# Patient Record
Sex: Female | Born: 1975 | Race: White | Hispanic: No | Marital: Single | Smoking: Never smoker
Health system: Southern US, Community
[De-identification: ages and names within clinical notes are randomized; demographics above are authoritative.]

## PROBLEM LIST (undated history)

## (undated) DIAGNOSIS — I10 Essential (primary) hypertension: Secondary | ICD-10-CM

## (undated) HISTORY — DX: Essential (primary) hypertension: I10

## (undated) HISTORY — PX: NO PAST SURGERIES: SHX2092

---

## 1998-10-03 ENCOUNTER — Other Ambulatory Visit: Admission: RE | Admit: 1998-10-03 | Discharge: 1998-10-03 | Payer: Self-pay | Admitting: *Deleted

## 2006-01-05 ENCOUNTER — Emergency Department: Payer: Self-pay | Admitting: Emergency Medicine

## 2006-01-05 ENCOUNTER — Other Ambulatory Visit: Payer: Self-pay

## 2018-04-21 ENCOUNTER — Telehealth: Payer: Self-pay | Admitting: Family Medicine

## 2018-04-21 NOTE — Telephone Encounter (Signed)
Called patient to speak with her about her medical records request from when she was here in 2006.  Please send to MR if she calls back.

## 2021-10-20 ENCOUNTER — Other Ambulatory Visit: Payer: Self-pay

## 2021-10-20 ENCOUNTER — Emergency Department: Payer: Self-pay

## 2021-10-20 ENCOUNTER — Inpatient Hospital Stay
Admission: EM | Admit: 2021-10-20 | Discharge: 2021-10-26 | DRG: 871 | Disposition: A | Discharge status: Home / Self Care | Payer: Self-pay | Attending: Hospitalist | Admitting: Hospitalist

## 2021-10-20 ENCOUNTER — Encounter: Payer: Self-pay | Admitting: Internal Medicine

## 2021-10-20 DIAGNOSIS — R600 Localized edema: Secondary | ICD-10-CM | POA: Diagnosis present

## 2021-10-20 DIAGNOSIS — Z6841 Body Mass Index (BMI) 40.0 and over, adult: Secondary | ICD-10-CM

## 2021-10-20 DIAGNOSIS — F411 Generalized anxiety disorder: Secondary | ICD-10-CM | POA: Diagnosis present

## 2021-10-20 DIAGNOSIS — Z833 Family history of diabetes mellitus: Secondary | ICD-10-CM

## 2021-10-20 DIAGNOSIS — Z77098 Contact with and (suspected) exposure to other hazardous, chiefly nonmedicinal, chemicals: Secondary | ICD-10-CM | POA: Diagnosis present

## 2021-10-20 DIAGNOSIS — I071 Rheumatic tricuspid insufficiency: Secondary | ICD-10-CM | POA: Diagnosis present

## 2021-10-20 DIAGNOSIS — I160 Hypertensive urgency: Secondary | ICD-10-CM | POA: Diagnosis present

## 2021-10-20 DIAGNOSIS — M7989 Other specified soft tissue disorders: Secondary | ICD-10-CM | POA: Diagnosis present

## 2021-10-20 DIAGNOSIS — L97919 Non-pressure chronic ulcer of unspecified part of right lower leg with unspecified severity: Secondary | ICD-10-CM | POA: Diagnosis present

## 2021-10-20 DIAGNOSIS — K7581 Nonalcoholic steatohepatitis (NASH): Secondary | ICD-10-CM | POA: Diagnosis present

## 2021-10-20 DIAGNOSIS — A4102 Sepsis due to Methicillin resistant Staphylococcus aureus: Principal | ICD-10-CM | POA: Diagnosis present

## 2021-10-20 DIAGNOSIS — I110 Hypertensive heart disease with heart failure: Secondary | ICD-10-CM | POA: Diagnosis present

## 2021-10-20 DIAGNOSIS — I872 Venous insufficiency (chronic) (peripheral): Secondary | ICD-10-CM | POA: Diagnosis present

## 2021-10-20 DIAGNOSIS — Z20822 Contact with and (suspected) exposure to covid-19: Secondary | ICD-10-CM | POA: Diagnosis present

## 2021-10-20 DIAGNOSIS — R7989 Other specified abnormal findings of blood chemistry: Secondary | ICD-10-CM | POA: Diagnosis present

## 2021-10-20 DIAGNOSIS — F22 Delusional disorders: Secondary | ICD-10-CM | POA: Diagnosis present

## 2021-10-20 DIAGNOSIS — R768 Other specified abnormal immunological findings in serum: Secondary | ICD-10-CM | POA: Diagnosis present

## 2021-10-20 DIAGNOSIS — E872 Acidosis, unspecified: Secondary | ICD-10-CM | POA: Diagnosis present

## 2021-10-20 DIAGNOSIS — Z634 Disappearance and death of family member: Secondary | ICD-10-CM

## 2021-10-20 DIAGNOSIS — I5033 Acute on chronic diastolic (congestive) heart failure: Secondary | ICD-10-CM | POA: Diagnosis present

## 2021-10-20 DIAGNOSIS — A419 Sepsis, unspecified organism: Secondary | ICD-10-CM | POA: Diagnosis present

## 2021-10-20 DIAGNOSIS — L97929 Non-pressure chronic ulcer of unspecified part of left lower leg with unspecified severity: Secondary | ICD-10-CM | POA: Diagnosis present

## 2021-10-20 DIAGNOSIS — I878 Other specified disorders of veins: Secondary | ICD-10-CM | POA: Diagnosis present

## 2021-10-20 DIAGNOSIS — L03116 Cellulitis of left lower limb: Secondary | ICD-10-CM | POA: Diagnosis present

## 2021-10-20 DIAGNOSIS — D649 Anemia, unspecified: Secondary | ICD-10-CM | POA: Diagnosis present

## 2021-10-20 DIAGNOSIS — R140 Abdominal distension (gaseous): Secondary | ICD-10-CM | POA: Diagnosis present

## 2021-10-20 DIAGNOSIS — L03115 Cellulitis of right lower limb: Secondary | ICD-10-CM | POA: Diagnosis present

## 2021-10-20 DIAGNOSIS — R6520 Severe sepsis without septic shock: Secondary | ICD-10-CM | POA: Diagnosis present

## 2021-10-20 DIAGNOSIS — R601 Generalized edema: Secondary | ICD-10-CM

## 2021-10-20 DIAGNOSIS — R10819 Abdominal tenderness, unspecified site: Secondary | ICD-10-CM

## 2021-10-20 DIAGNOSIS — R000 Tachycardia, unspecified: Secondary | ICD-10-CM | POA: Diagnosis present

## 2021-10-20 DIAGNOSIS — R809 Proteinuria, unspecified: Secondary | ICD-10-CM | POA: Diagnosis present

## 2021-10-20 LAB — CBC WITH DIFFERENTIAL/PLATELET
Abs Immature Granulocytes: 0.06 10*3/uL (ref 0.00–0.07)
Basophils Absolute: 0 10*3/uL (ref 0.0–0.1)
Basophils Relative: 0 %
Eosinophils Absolute: 0 10*3/uL (ref 0.0–0.5)
Eosinophils Relative: 0 %
HCT: 36.8 % (ref 36.0–46.0)
Hemoglobin: 10.8 g/dL — ABNORMAL LOW (ref 12.0–15.0)
Immature Granulocytes: 1 %
Lymphocytes Relative: 10 %
Lymphs Abs: 1.2 10*3/uL (ref 0.7–4.0)
MCH: 21.2 pg — ABNORMAL LOW (ref 26.0–34.0)
MCHC: 29.3 g/dL — ABNORMAL LOW (ref 30.0–36.0)
MCV: 72.3 fL — ABNORMAL LOW (ref 80.0–100.0)
Monocytes Absolute: 1 10*3/uL (ref 0.1–1.0)
Monocytes Relative: 8 %
Neutro Abs: 9.5 10*3/uL — ABNORMAL HIGH (ref 1.7–7.7)
Neutrophils Relative %: 81 %
Platelets: 433 10*3/uL — ABNORMAL HIGH (ref 150–400)
RBC: 5.09 MIL/uL (ref 3.87–5.11)
RDW: 21 % — ABNORMAL HIGH (ref 11.5–15.5)
WBC: 11.8 10*3/uL — ABNORMAL HIGH (ref 4.0–10.5)
nRBC: 0.2 % (ref 0.0–0.2)

## 2021-10-20 LAB — URINE DRUG SCREEN, QUALITATIVE (ARMC ONLY)
Amphetamines, Ur Screen: NOT DETECTED
Barbiturates, Ur Screen: NOT DETECTED
Benzodiazepine, Ur Scrn: NOT DETECTED
Cannabinoid 50 Ng, Ur ~~LOC~~: NOT DETECTED
Cocaine Metabolite,Ur ~~LOC~~: NOT DETECTED
MDMA (Ecstasy)Ur Screen: NOT DETECTED
Methadone Scn, Ur: NOT DETECTED
Opiate, Ur Screen: NOT DETECTED
Phencyclidine (PCP) Ur S: NOT DETECTED
Tricyclic, Ur Screen: NOT DETECTED

## 2021-10-20 LAB — COMPREHENSIVE METABOLIC PANEL
ALT: 34 U/L (ref 0–44)
AST: 45 U/L — ABNORMAL HIGH (ref 15–41)
Albumin: 3.8 g/dL (ref 3.5–5.0)
Alkaline Phosphatase: 80 U/L (ref 38–126)
Anion gap: 8 (ref 5–15)
BUN: 16 mg/dL (ref 6–20)
CO2: 24 mmol/L (ref 22–32)
Calcium: 8.7 mg/dL — ABNORMAL LOW (ref 8.9–10.3)
Chloride: 103 mmol/L (ref 98–111)
Creatinine, Ser: 0.94 mg/dL (ref 0.44–1.00)
GFR, Estimated: >60 mL/min (ref >60)
Glucose, Bld: 111 mg/dL — ABNORMAL HIGH (ref 70–99)
Potassium: 3.6 mmol/L (ref 3.5–5.1)
Sodium: 135 mmol/L (ref 135–145)
Total Bilirubin: 2.1 mg/dL — ABNORMAL HIGH (ref 0.3–1.2)
Total Protein: 7.4 g/dL (ref 6.5–8.1)

## 2021-10-20 LAB — URINALYSIS, ROUTINE W REFLEX MICROSCOPIC
Bilirubin Urine: NEGATIVE
Glucose, UA: NEGATIVE mg/dL
Hgb urine dipstick: NEGATIVE
Ketones, ur: 5 mg/dL — AB
Leukocytes,Ua: NEGATIVE
Nitrite: NEGATIVE
Protein, ur: 100 mg/dL — AB
Specific Gravity, Urine: 1.02 (ref 1.005–1.030)
pH: 5 (ref 5.0–8.0)

## 2021-10-20 LAB — PROTIME-INR
INR: 1.4 — ABNORMAL HIGH (ref 0.8–1.2)
Prothrombin Time: 16.9 seconds — ABNORMAL HIGH (ref 11.4–15.2)

## 2021-10-20 LAB — SALICYLATE LEVEL: Salicylate Lvl: <7 mg/dL — ABNORMAL LOW (ref 7.0–30.0)

## 2021-10-20 LAB — SEDIMENTATION RATE: Sed Rate: 7 mm/hr (ref 0–20)

## 2021-10-20 LAB — LACTIC ACID, PLASMA
Lactic Acid, Venous: 2.4 mmol/L (ref 0.5–1.9)
Lactic Acid, Venous: 2.1 mmol/L (ref 0.5–1.9)
Lactic Acid, Venous: 2.4 mmol/L (ref 0.5–1.9)
Lactic Acid, Venous: 1.8 mmol/L (ref 0.5–1.9)

## 2021-10-20 LAB — RESP PANEL BY RT-PCR (FLU A&B, COVID) ARPGX2
Influenza A by PCR: NEGATIVE
Influenza B by PCR: NEGATIVE
SARS Coronavirus 2 by RT PCR: NEGATIVE

## 2021-10-20 LAB — HIV ANTIBODY (ROUTINE TESTING W REFLEX): HIV Screen 4th Generation wRfx: NONREACTIVE

## 2021-10-20 LAB — APTT: aPTT: 29 seconds (ref 24–36)

## 2021-10-20 LAB — BRAIN NATRIURETIC PEPTIDE: B Natriuretic Peptide: 569.4 pg/mL — ABNORMAL HIGH (ref 0.0–100.0)

## 2021-10-20 LAB — ACETAMINOPHEN LEVEL: Acetaminophen (Tylenol), Serum: 19 ug/mL (ref 10–30)

## 2021-10-20 LAB — POC URINE PREG, ED: Preg Test, Ur: NEGATIVE

## 2021-10-20 LAB — ETHANOL: Alcohol, Ethyl (B): <10 mg/dL (ref <10)

## 2021-10-20 LAB — PROCALCITONIN: Procalcitonin: <0.1 ng/mL

## 2021-10-20 MED ORDER — HYDRALAZINE HCL 20 MG/ML IJ SOLN: 5.0000 mg | INTRAMUSCULAR | Status: DC | PRN

## 2021-10-20 MED ORDER — GABAPENTIN 100 MG PO CAPS
100.0000 mg | ORAL_CAPSULE | Freq: Three times a day (TID) | ORAL | Status: DC
  Administered 2021-10-20 – 2021-10-26 (×17): 100 mg via ORAL
  Filled 2021-10-20 (×18): qty 1

## 2021-10-20 MED ORDER — LABETALOL HCL 5 MG/ML IV SOLN
5.0000 mg | Freq: Once | INTRAVENOUS | Status: AC
  Administered 2021-10-20: 5 mg via INTRAVENOUS
  Filled 2021-10-20: qty 4

## 2021-10-20 MED ORDER — HYDRALAZINE HCL 20 MG/ML IJ SOLN
10.0000 mg | Freq: Once | INTRAMUSCULAR | Status: AC
  Administered 2021-10-20: 10 mg via INTRAVENOUS
  Filled 2021-10-20: qty 1

## 2021-10-20 MED ORDER — ACETAMINOPHEN 500 MG PO TABS
1000.0000 mg | ORAL_TABLET | Freq: Once | ORAL | Status: AC
  Administered 2021-10-20: 1000 mg via ORAL
  Filled 2021-10-20: qty 2

## 2021-10-20 MED ORDER — ONDANSETRON HCL 4 MG/2ML IJ SOLN: 4.0000 mg | Freq: Three times a day (TID) | INTRAMUSCULAR | Status: DC | PRN

## 2021-10-20 MED ORDER — ENOXAPARIN SODIUM 60 MG/0.6ML IJ SOSY
0.5000 mg/kg | PREFILLED_SYRINGE | INTRAMUSCULAR | Status: DC
  Administered 2021-10-20 – 2021-10-23 (×4): 57.5 mg via SUBCUTANEOUS
  Administered 2021-10-24 – 2021-10-25 (×2): 60 mg via SUBCUTANEOUS
  Filled 2021-10-20: qty 0.57
  Filled 2021-10-20: qty 0.6
  Filled 2021-10-20: qty 0.57
  Filled 2021-10-20: qty 0.6
  Filled 2021-10-20: qty 0.57
  Filled 2021-10-20: qty 0.6

## 2021-10-20 MED ORDER — AMLODIPINE BESYLATE 5 MG PO TABS
5.0000 mg | ORAL_TABLET | Freq: Every day | ORAL | Status: DC
  Administered 2021-10-20 – 2021-10-21 (×2): 5 mg via ORAL
  Filled 2021-10-20 (×2): qty 1

## 2021-10-20 MED ORDER — IBUPROFEN 400 MG PO TABS
200.0000 mg | ORAL_TABLET | Freq: Four times a day (QID) | ORAL | Status: DC | PRN
  Administered 2021-10-21: 200 mg via ORAL
  Filled 2021-10-20: qty 1

## 2021-10-20 MED ORDER — FUROSEMIDE 10 MG/ML IJ SOLN
40.0000 mg | Freq: Once | INTRAMUSCULAR | Status: AC
  Administered 2021-10-20: 40 mg via INTRAVENOUS
  Filled 2021-10-20: qty 4

## 2021-10-20 MED ORDER — SODIUM CHLORIDE 0.9 % IV SOLN
2.0000 g | INTRAVENOUS | Status: DC
  Administered 2021-10-21 – 2021-10-23 (×3): 2 g via INTRAVENOUS
  Filled 2021-10-20: qty 20
  Filled 2021-10-20: qty 2
  Filled 2021-10-20 (×2): qty 20

## 2021-10-20 MED ORDER — OXYCODONE HCL 5 MG PO TABS
5.0000 mg | ORAL_TABLET | Freq: Four times a day (QID) | ORAL | Status: DC | PRN
  Administered 2021-10-20 – 2021-10-22 (×5): 5 mg via ORAL
  Filled 2021-10-20 (×6): qty 1

## 2021-10-20 MED ORDER — SODIUM CHLORIDE 0.9 % IV SOLN
1.0000 g | Freq: Once | INTRAVENOUS | Status: AC
  Administered 2021-10-20: 1 g via INTRAVENOUS
  Filled 2021-10-20: qty 10

## 2021-10-20 MED ORDER — ENOXAPARIN SODIUM 40 MG/0.4ML IJ SOSY: 40.0000 mg | PREFILLED_SYRINGE | INTRAMUSCULAR | Status: DC

## 2021-10-20 MED ORDER — OXYCODONE HCL 5 MG PO TABS
5.0000 mg | ORAL_TABLET | Freq: Once | ORAL | Status: AC
  Administered 2021-10-20: 5 mg via ORAL
  Filled 2021-10-20: qty 1
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
## 2021-10-20 NOTE — ED Notes (Signed)
RN unable to obtain lactic after obtaining IV access.
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
## 2021-10-20 NOTE — Progress Notes (Signed)
PHARMACIST - PHYSICIAN COMMUNICATION  CONCERNING:  Enoxaparin (Lovenox) for DVT Prophylaxis    RECOMMENDATION: Patient was prescribed enoxaprin 40mg  q24 hours for VTE prophylaxis.   Filed Weights   10/20/21 0917  Weight: 117 kg (258 lb)    Body mass index is 42.93 kg/m.  Estimated Creatinine Clearance: 94.6 mL/min (by C-G formula based on SCr of 0.94 mg/dL).   Based on [facility] policy patient is candidate for enoxaparin 0.5mg /kg TBW SQ every 24 hours based on BMI being >30.  Patient is candidate for enoxaparin 30mg  every 24 hours based on CrCl <67ml/min or Weight <45kg  DESCRIPTION: Pharmacy has adjusted enoxaparin dose per [facility] policy.  Patient is now receiving enoxaparin 57.5 mg every 24 hours    31m, PharmD Clinical Pharmacist  10/20/2021 4:49 PM

## 2021-10-20 NOTE — ED Notes (Addendum)
Lab called to collect blood specimen

## 2021-10-20 NOTE — ED Notes (Addendum)
IV team blood draw able to obtain 1 set of blood cultures at this time. MD made aware. Still awaiting lab to attempt 2nd set

## 2021-10-20 NOTE — ED Notes (Signed)
Dinner tray given

## 2021-10-20 NOTE — ED Triage Notes (Signed)
Pt reports not seeing a dr since 2015, no daily meds or surgeries, pt states that she noticed her feet swelling once she was in the bath tub that draino had been poured down the drain, pt states she then had swelling that moved up into her abd and cont to have swelling there, pt has weeping noticed coming from her legs while in triage and puddiling in her crocs,10 days later pt noticed wounds and sores to her feet and legs, pt is visibly out of breath talking and with walking into triage bp in triage of 244/136 after checking three times and changing bp cuffs

## 2021-10-20 NOTE — ED Notes (Signed)
IV team at bedside to attempt IV access 

## 2021-10-20 NOTE — ED Notes (Signed)
Lab called to attempt to obtain blood cultures again

## 2021-10-20 NOTE — ED Notes (Signed)
TTS at bedside. 

## 2021-10-20 NOTE — ED Notes (Signed)
Lab unable to obtain blood cultures. Admitting MND made aware.

## 2021-10-21 ENCOUNTER — Inpatient Hospital Stay: Payer: Self-pay

## 2021-10-21 ENCOUNTER — Inpatient Hospital Stay
Admit: 2021-10-21 | Discharge: 2021-10-21 | Disposition: A | Discharge status: Home / Self Care | Payer: Self-pay | Attending: Internal Medicine | Admitting: Internal Medicine

## 2021-10-21 DIAGNOSIS — A419 Sepsis, unspecified organism: Secondary | ICD-10-CM

## 2021-10-21 DIAGNOSIS — I509 Heart failure, unspecified: Secondary | ICD-10-CM

## 2021-10-21 DIAGNOSIS — I161 Hypertensive emergency: Secondary | ICD-10-CM

## 2021-10-21 DIAGNOSIS — R600 Localized edema: Secondary | ICD-10-CM

## 2021-10-21 DIAGNOSIS — R6520 Severe sepsis without septic shock: Secondary | ICD-10-CM

## 2021-10-21 LAB — BASIC METABOLIC PANEL
Anion gap: 13 (ref 5–15)
BUN: 16 mg/dL (ref 6–20)
CO2: 26 mmol/L (ref 22–32)
Calcium: 8.4 mg/dL — ABNORMAL LOW (ref 8.9–10.3)
Chloride: 100 mmol/L (ref 98–111)
Creatinine, Ser: 0.82 mg/dL (ref 0.44–1.00)
GFR, Estimated: >60 mL/min (ref >60)
Glucose, Bld: 82 mg/dL (ref 70–99)
Potassium: 3.8 mmol/L (ref 3.5–5.1)
Sodium: 139 mmol/L (ref 135–145)

## 2021-10-21 LAB — LACTIC ACID, PLASMA
Lactic Acid, Venous: 2.4 mmol/L (ref 0.5–1.9)
Lactic Acid, Venous: 1.8 mmol/L (ref 0.5–1.9)

## 2021-10-21 LAB — CK: Total CK: 310 U/L — ABNORMAL HIGH (ref 38–234)

## 2021-10-21 LAB — CBC
HCT: 35.5 % — ABNORMAL LOW (ref 36.0–46.0)
Hemoglobin: 10.4 g/dL — ABNORMAL LOW (ref 12.0–15.0)
MCH: 21.1 pg — ABNORMAL LOW (ref 26.0–34.0)
MCHC: 29.3 g/dL — ABNORMAL LOW (ref 30.0–36.0)
MCV: 71.9 fL — ABNORMAL LOW (ref 80.0–100.0)
Platelets: 395 10*3/uL (ref 150–400)
RBC: 4.94 MIL/uL (ref 3.87–5.11)
RDW: 20.9 % — ABNORMAL HIGH (ref 11.5–15.5)
WBC: 11.6 10*3/uL — ABNORMAL HIGH (ref 4.0–10.5)
nRBC: 0 % (ref 0.0–0.2)

## 2021-10-21 LAB — C-REACTIVE PROTEIN: CRP: 3.4 mg/dL — ABNORMAL HIGH (ref <1.0)

## 2021-10-21 LAB — MRSA NEXT GEN BY PCR, NASAL: MRSA by PCR Next Gen: DETECTED — AB

## 2021-10-21 LAB — MAGNESIUM: Magnesium: 2.2 mg/dL (ref 1.7–2.4)

## 2021-10-21 MED ORDER — FUROSEMIDE 10 MG/ML IJ SOLN
40.0000 mg | Freq: Every day | INTRAMUSCULAR | Status: DC
  Administered 2021-10-21: 40 mg via INTRAVENOUS
  Filled 2021-10-21: qty 4

## 2021-10-21 MED ORDER — MUPIROCIN 2 % EX OINT
1.0000 "application " | TOPICAL_OINTMENT | Freq: Two times a day (BID) | CUTANEOUS | Status: AC
  Administered 2021-10-21 – 2021-10-26 (×10): 1 via NASAL
  Filled 2021-10-21: qty 22

## 2021-10-21 MED ORDER — BISACODYL 10 MG RE SUPP
10.0000 mg | Freq: Once | RECTAL | Status: DC
  Filled 2021-10-21: qty 1

## 2021-10-21 MED ORDER — METOPROLOL TARTRATE 25 MG PO TABS
25.0000 mg | ORAL_TABLET | Freq: Two times a day (BID) | ORAL | Status: DC
  Administered 2021-10-21 – 2021-10-25 (×8): 25 mg via ORAL
  Filled 2021-10-21 (×8): qty 1

## 2021-10-21 MED ORDER — GUAIFENESIN-DM 100-10 MG/5ML PO SYRP
5.0000 mL | ORAL_SOLUTION | ORAL | Status: DC | PRN
  Administered 2021-10-21 – 2021-10-25 (×6): 5 mL via ORAL
  Filled 2021-10-21 (×6): qty 5

## 2021-10-21 MED ORDER — DOCUSATE SODIUM 100 MG PO CAPS
100.0000 mg | ORAL_CAPSULE | Freq: Once | ORAL | Status: AC
Start: 2021-10-21 — End: 2021-10-21
  Administered 2021-10-21: 100 mg via ORAL
  Filled 2021-10-21: qty 1

## 2021-10-21 MED ORDER — CHLORHEXIDINE GLUCONATE CLOTH 2 % EX PADS
6.0000 | MEDICATED_PAD | Freq: Every day | CUTANEOUS | Status: DC
  Administered 2021-10-22 – 2021-10-25 (×3): 6 via TOPICAL

## 2021-10-21 MED ORDER — ACETAMINOPHEN 325 MG PO TABS
650.0000 mg | ORAL_TABLET | Freq: Four times a day (QID) | ORAL | Status: DC | PRN
  Administered 2021-10-24 – 2021-10-25 (×3): 650 mg via ORAL
  Filled 2021-10-21 (×3): qty 2

## 2021-10-21 MED ORDER — LABETALOL HCL 5 MG/ML IV SOLN
10.0000 mg | INTRAVENOUS | Status: DC | PRN
  Administered 2021-10-21 – 2021-10-23 (×4): 10 mg via INTRAVENOUS
  Filled 2021-10-21 (×5): qty 4

## 2021-10-21 MED ORDER — FUROSEMIDE 10 MG/ML IJ SOLN
40.0000 mg | Freq: Two times a day (BID) | INTRAMUSCULAR | Status: DC
  Administered 2021-10-21 – 2021-10-26 (×10): 40 mg via INTRAVENOUS
  Filled 2021-10-21 (×10): qty 4

## 2021-10-21 NOTE — Evaluation (Signed)
Physical Therapy Evaluation Patient Details Name: Cynthia Hodge MRN: [id number] DOB: [birth year] Today's Date: 10/21/2021  History of Present Illness  Pt is a 45 y/o F admitted on 10/20/21 with c/o leg swelling & ulcerations. Pt reports she got into her bathtub nearly a month ago that had some residual Draino in it & her legs started to swell around that time. Pt reports painful ulcerations with srurrounding redness developed over the past 3-4 days. Pt is being treated for severe sepsis due to cellulitis of lower legs with multiple ulcers & BLE edema of unclear etiology.  Clinical Impression  Pt seen for PT evaluation with pt reporting she was independent without AD prior to admission, able to get in/out of bathtub, & no falls. On this date, pt performs bed mobility with mod I, transfers without AD & gait without AD with gait pattern as noted below. Pt appears to be primarily limited by BLE foot pain as expected, but otherwise has no acute PT needs. Encouraged pt to ambulate while in acute setting as much as possible & limit time in bed. At this time, PT to sign off, please re-consult if new needs arise.       Recommendations for follow up therapy are one component of a multi-disciplinary discharge planning process, led by the attending physician.  Recommendations may be updated based on patient status, additional functional criteria and insurance authorization.  Follow Up Recommendations No PT follow up    Assistance Recommended at Discharge None  Functional Status Assessment Patient has not had a recent decline in their functional status  Equipment Recommendations  None recommended by PT    Recommendations for Other Services       Precautions / Restrictions Precautions Precautions: None Restrictions Weight Bearing Restrictions: No      Mobility  Bed Mobility Overal bed mobility: Modified Independent             General bed mobility comments: supine>sit     Transfers Overall transfer level: Independent Equipment used: None                    Ambulation/Gait Ambulation/Gait assistance: Independent Gait Distance (Feet): 60 Feet Assistive device: None Gait Pattern/deviations: Decreased step length - right;Decreased step length - left;Decreased dorsiflexion - left;Decreased dorsiflexion - right;Wide base of support Gait velocity: decreased     General Gait Details: Pt limited by BLE pain.  Stairs            Wheelchair Mobility    Modified Rankin (Stroke Patients Only)       Balance Overall balance assessment: Mild deficits observed, not formally tested                                           Pertinent Vitals/Pain Pain Assessment: Faces Faces Pain Scale: Hurts little more Pain Location: BLE pain - reports no pain at rest but endorses increasing pain with movement & weight bearing, reports skin is tender Pain Descriptors / Indicators: Discomfort Pain Intervention(s): Limited activity within patient's tolerance;Monitored during session    Home Living Family/patient expects to be discharged to:: Private residence Living Arrangements: Alone Available Help at Discharge: Friend(s);Available PRN/intermittently Type of Home: House Home Access: Level entry       Home Layout: One level Home Equipment: None      Prior Function Prior Level of Function : Independent/Modified Independent  Mobility Comments: Independent without AD, denies falls in the past 6 months. Reports she's had a friend assist her with obtaining meals this past week 2/2 worsening BLE.       Hand Dominance        Extremity/Trunk Assessment   Upper Extremity Assessment Upper Extremity Assessment: Overall WFL for tasks assessed    Lower Extremity Assessment Lower Extremity Assessment:  (BLE edema & erythema, wound to medial aspect of L lower leg)    Cervical / Trunk Assessment Cervical / Trunk  Assessment: Normal  Communication      Cognition Arousal/Alertness: Awake/alert Behavior During Therapy: WFL for tasks assessed/performed Overall Cognitive Status: Within Functional Limits for tasks assessed                                          General Comments      Exercises     Assessment/Plan    PT Assessment Patient does not need any further PT services  PT Problem List         PT Treatment Interventions      PT Goals (Current goals can be found in the Care Plan section)  Acute Rehab PT Goals Patient Stated Goal: decreased pain PT Goal Formulation: With patient Time For Goal Achievement: 11/04/21 Potential to Achieve Goals: Good    Frequency     Barriers to discharge        Co-evaluation               AM-PAC PT "6 Clicks" Mobility  Outcome Measure Help needed turning from your back to your side while in a flat bed without using bedrails?: None Help needed moving from lying on your back to sitting on the side of a flat bed without using bedrails?: None Help needed moving to and from a bed to a chair (including a wheelchair)?: None Help needed standing up from a chair using your arms (e.g., wheelchair or bedside chair)?: None Help needed to walk in hospital room?: None Help needed climbing 3-5 steps with a railing? : None 6 Click Score: 24    End of Session   Activity Tolerance: Patient limited by pain Patient left: with call bell/phone within reach (sitting EOB) Nurse Communication: Mobility status      Time: 7711-6579 PT Time Calculation (min) (ACUTE ONLY): 10 min   Charges:   PT Evaluation $PT Eval Low Complexity: 1 Low          Aleda Grana, PT, DPT 10/21/21, 2:16 PM   Sandi Mariscal 10/21/2021, 2:15 PM

## 2021-10-21 NOTE — Progress Notes (Signed)
PROGRESS NOTE  Cynthia Hodge LMB:[id number] DOB: 1976/06/21   PCP: Pcp, No  Patient is from: Home.  DOA: 10/20/2021 LOS: 1  Chief complaints:  Chief Complaint  Patient presents with   Chemical Exposure   Leg Swelling   Shortness of Breath     Brief Narrative / Interim history: 45 year old F with PMH of GAD and whitecoat hypertension presenting with BLE swelling, pain, ulcer, weight gain and abdominal swelling, and admitted for possible lower extremity cellulitis with ulceration, new CHF and uncontrolled hypertension.  No report of fever or chills.  BP 244/141.  HR 124.  RR 26.  Normal saturation on room air.  WBC 11.8.  Lactic acid 2.4.  BNP 570.  T bili 2.1.  CXR showed cardiomegaly and interstitial edema.  LE [place] negative for DVT.  Patient was started on IV ceftriaxone, IV Lasix.  TTE ordered.   Patient reported concern about someone poisoning her bath that caused her to have swelling and ulceration.  Psychiatry consulted and recommended starting gabapentin but did not feel she needs inpatient psychiatric hospitalization.  Subjective: Seen and examined earlier this morning.  Reports improvement in her breathing, abdominal and leg swelling.  She states that the abdomen and legs are less tight.  She denies chest pain, fever or chills.  She denies GI or UTI symptoms.  Objective: Vitals:   10/20/21 2012 10/20/21 2124 10/21/21 0543 10/21/21 0847  BP: (!) 165/107 109/63 127/64 (!) 183/94  Pulse: (!) 112  90 97  Resp: 17  17 15   Temp:   98.1 F (36.7 C) 97.8 F (36.6 C)  TempSrc:    Oral  SpO2: 97%  94% 97%  Weight:      Height:        Intake/Output Summary (Last 24 hours) at 10/21/2021 1246 Last data filed at 10/20/2021 1954 Gross per 24 hour  Intake 100 ml  Output 1500 ml  Net -1400 ml   Filed Weights   10/20/21 0917  Weight: 117 kg    Examination:  GENERAL: No apparent distress.  Nontoxic. HEENT: MMM.  Vision and hearing grossly intact.  NECK: Supple.  No apparent  JVD.  RESP: 97% on RA.  No IWOB.  Fair aeration bilaterally. CVS:  RRR. Heart sounds normal.  ABD/GI/GU: BS+. Abd soft, NTND.  Edema and induration of abdominal pannus. MSK/EXT:  Moves extremities. No apparent deformity.  2+ BLE edema SKIN: Bilateral lower extremity swelling, erythema and ulceration.  See media for more NEURO: Awake, alert and oriented appropriately.  No apparent focal neuro deficit. PSYCH: Calm. Normal affect.             Procedures:  None  Microbiology summarized: FEOFH-21 and influenza PCR nonreactive.  Assessment & Plan: Severe sepsis due to bilateral lower extremity cellulitis with ulceration: POA: Was tachycardic, tachypneic with leukocytosis and lactic acidosis.  However, she seems to have some underlying venous insufficiency and possible new acute CHF contributing to leg swelling.  Also no fever.  Blood cultures NGTD.  CRP 3.4.  ESR within normal.  LE [place] negative for DVT. -Continue IV Rocephin.  May add vancomycin if MRSA PCR positive. -WOCN consulted. -Hold off IV fluid given concern for acute CHF   Acute CHF: Unknown type.  Patient with BLE edema, elevated BNP to 570, cardiomegaly and interstitial edema with uncontrolled hypertension.  Patient reports history of whitecoat hypertension and has not been on antihypertensive meds.  She has dyspnea but denies orthopnea or PND.  Seems to have anasarca  on exam involving the abdominal pannus.  Started on IV Lasix, and had 1.5 L UOP yesterday.  None charted from overnight. -Increase IV Lasix to 40 mg twice daily -Discontinue ibuprofen. -Follow echocardiogram -Sodium and fluid restriction -Monitor fluid status, renal functions and electrolytes.  Abdominal distention/swelling-seems to have anasarca with abdominal wall edema -Abdominal ultrasound -Diuretics as above. -Follow-up acute hepatitis panel   Hypertensive urgency/emergency: BP 244/141 on arrival.  Volume overload with possible acute CHF.   Improved. -Diuretics as above -Add low-dose metoprolol -Labetalol as needed with parameters. -Further adjustment based on echo finding.   Generalized anxiety disorder: consulted [place], [place]. -started gabapentin 100 mg 3 times daily   Physical deconditioning -PT/OT eval.  Body mass index is 42.93 kg/m.         DVT prophylaxis:    On subcu Lovenox  Code Status: Full code Family Communication: Patient and/or RN. Available if any question.  Level of care: Progressive Status is: Inpatient  Remains inpatient appropriate because: Severe sepsis due to lower extremity cellulitis requiring IV antibiotics, volume overload/acute CHF requiring IV diuretics and further evaluation   Consultants:  Psychiatry   Sch Meds:  Scheduled Meds:  enoxaparin (LOVENOX) injection  0.5 mg/kg Subcutaneous Q24H   furosemide  40 mg Intravenous BID   gabapentin  100 mg Oral TID   Continuous Infusions:  cefTRIAXone (ROCEPHIN)  IV     PRN Meds:.acetaminophen, labetalol, ondansetron (ZOFRAN) IV, oxyCODONE  Antimicrobials: Anti-infectives (From admission, onward)    Start     Dose/Rate Route Frequency Ordered Stop   10/21/21 1700  cefTRIAXone (ROCEPHIN) 2 g in sodium chloride 0.9 % 100 mL IVPB        2 g 200 mL/hr over 30 Minutes Intravenous Every 24 hours 10/20/21 1738     10/20/21 1745  cefTRIAXone (ROCEPHIN) 1 g in sodium chloride 0.9 % 100 mL IVPB        1 g 200 mL/hr over 30 Minutes Intravenous  Once 10/20/21 1738 10/20/21 1954   10/20/21 0930  cefTRIAXone (ROCEPHIN) 1 g in sodium chloride 0.9 % 100 mL IVPB        1 g 200 mL/hr over 30 Minutes Intravenous  Once 10/20/21 0916 10/20/21 1638        I have personally reviewed the following labs and images: CBC: Recent Labs  Lab 10/20/21 1030 10/21/21 0843  WBC 11.8* 11.6*  NEUTROABS 9.5*  --   HGB 10.8* 10.4*  HCT 36.8 35.5*  MCV 72.3* 71.9*  PLT 433* 395   BMP &GFR Recent Labs  Lab 10/20/21 1030 10/21/21 0500  NA 135  139  K 3.6 3.8  CL 103 100  CO2 24 26  GLUCOSE 111* 82  BUN 16 16  CREATININE 0.94 0.82  CALCIUM 8.7* 8.4*  MG  --  2.2   Estimated Creatinine Clearance: 108.5 mL/min (by C-G formula based on SCr of 0.82 mg/dL). Liver & Pancreas: Recent Labs  Lab 10/20/21 1030  AST 45*  ALT 34  ALKPHOS 80  BILITOT 2.1*  PROT 7.4  ALBUMIN 3.8   No results for input(s): LIPASE, AMYLASE in the last 168 hours. No results for input(s): AMMONIA in the last 168 hours. Diabetic: No results for input(s): HGBA1C in the last 72 hours. No results for input(s): GLUCAP in the last 168 hours. Cardiac Enzymes: Recent Labs  Lab 10/21/21 0500  CKTOTAL 310*   No results for input(s): PROBNP in the last 8760 hours. Coagulation Profile: Recent Labs  Lab 10/20/21  1614  INR 1.4*   Thyroid Function Tests: No results for input(s): TSH, T4TOTAL, FREET4, T3FREE, THYROIDAB in the last 72 hours. Lipid Profile: No results for input(s): CHOL, HDL, LDLCALC, TRIG, CHOLHDL, LDLDIRECT in the last 72 hours. Anemia Panel: No results for input(s): VITAMINB12, FOLATE, FERRITIN, TIBC, IRON, RETICCTPCT in the last 72 hours. Urine analysis:    Component Value Date/Time   COLORURINE YELLOW (A) 10/20/2021 1030   APPEARANCEUR HAZY (A) 10/20/2021 1030   LABSPEC 1.020 10/20/2021 1030   PHURINE 5.0 10/20/2021 1030   GLUCOSEU NEGATIVE 10/20/2021 1030   HGBUR NEGATIVE 10/20/2021 1030   BILIRUBINUR NEGATIVE 10/20/2021 1030   KETONESUR 5 (A) 10/20/2021 1030   PROTEINUR 100 (A) 10/20/2021 1030   NITRITE NEGATIVE 10/20/2021 1030   LEUKOCYTESUR NEGATIVE 10/20/2021 1030   Sepsis Labs: Invalid input(s): PROCALCITONIN, [place]  Microbiology: Recent Results (from the past 240 hour(s))  Resp Panel by RT-PCR (Flu A&B, Covid) Nasopharyngeal Swab     Status: None   Collection Time: 10/20/21 10:30 AM   Specimen: Nasopharyngeal Swab; Nasopharyngeal(NP) swabs in vial transport medium  Result Value Ref Range Status   SARS  Coronavirus 2 by RT PCR NEGATIVE NEGATIVE Final    Comment: (NOTE) SARS-CoV-2 target nucleic acids are NOT DETECTED.  The SARS-CoV-2 RNA is generally detectable in upper respiratory specimens during the acute phase of infection. The lowest concentration of SARS-CoV-2 viral copies this assay can detect is 138 copies/mL. A negative result does not preclude SARS-Cov-2 infection and should not be used as the sole basis for treatment or other patient management decisions. A negative result may occur with  improper specimen collection/handling, submission of specimen other than nasopharyngeal swab, presence of viral mutation(s) within the areas targeted by this assay, and inadequate number of viral copies(<138 copies/mL). A negative result must be combined with clinical observations, patient history, and epidemiological information. The expected result is Negative.  Fact Sheet for Patients:  EntrepreneurPulse.com.au  Fact Sheet for Healthcare Providers:  IncredibleEmployment.be  This test is no t yet approved or cleared by the [place] FDA and  has been authorized for detection and/or diagnosis of SARS-CoV-2 by FDA under an Emergency Use Authorization (EUA). This EUA will remain  in effect (meaning this test can be used) for the duration of the COVID-19 declaration under Section 564(b)(1) of the Act, 21 U.S.C.section 360bbb-3(b)(1), unless the authorization is terminated  or revoked sooner.       Influenza A by PCR NEGATIVE NEGATIVE Final   Influenza B by PCR NEGATIVE NEGATIVE Final    Comment: (NOTE) The Xpert Xpress SARS-CoV-2/FLU/RSV plus assay is intended as an aid in the diagnosis of influenza from Nasopharyngeal swab specimens and should not be used as a sole basis for treatment. Nasal washings and aspirates are unacceptable for Xpert Xpress SARS-CoV-2/FLU/RSV testing.  Fact Sheet for  Patients: EntrepreneurPulse.com.au  Fact Sheet for Healthcare Providers: IncredibleEmployment.be  This test is not yet approved or cleared by the [place] FDA and has been authorized for detection and/or diagnosis of SARS-CoV-2 by FDA under an Emergency Use Authorization (EUA). This EUA will remain in effect (meaning this test can be used) for the duration of the COVID-19 declaration under Section 564(b)(1) of the Act, 21 U.S.C. section 360bbb-3(b)(1), unless the authorization is terminated or revoked.  Performed at [facility], [address] [place], [place] [address]   Culture, blood (x 2)     Status: None (Preliminary result)   Collection Time: 10/20/21  3:31 PM  Specimen: BLOOD  Result Value Ref Range Status   Specimen Description BLOOD LEFT ANTECUBITAL  Final   Special Requests   Final    BOTTLES DRAWN AEROBIC AND ANAEROBIC Blood Culture results may not be optimal due to an inadequate volume of blood received in culture bottles   Culture   Final    NO GROWTH < 12 HOURS Performed at [facility], [address], [place], [place] [address]    Report Status PENDING  Incomplete  Culture, blood (x 2)     Status: None (Preliminary result)   Collection Time: 10/20/21  7:46 PM   Specimen: BLOOD  Result Value Ref Range Status   Specimen Description BLOOD RIGHT ANTECUBITAL  Final   Special Requests   Final    BOTTLES DRAWN AEROBIC AND ANAEROBIC Blood Culture adequate volume   Culture   Final    NO GROWTH < 12 HOURS Performed at [facility], [address] [place], [place] [address]    Report Status PENDING  Incomplete    Radiology Studies: No results found.    Dellanira Dillow T. [place]  If 7PM-7AM, please contact night-coverage [email or web address] 10/21/2021, 12:46 PM

## 2021-10-21 NOTE — ED Notes (Signed)
Patient out of room for imaging.

## 2021-10-21 NOTE — ED Notes (Signed)
Patient returned to room from ultrasound. Lab called for phlebotomist.

## 2021-10-21 NOTE — ED Notes (Signed)
Patient not in room - still in imaging

## 2021-10-21 NOTE — Progress Notes (Signed)
PT Cancellation Note  Patient Details Name: Cynthia Hodge MRN: [id number] DOB: 1976-01-01   Cancelled Treatment:    Reason Eval/Treat Not Completed: Patient at procedure or test/unavailable PT orders received, chart reviewed. Attempted to see pt for PT evaluation but pt not in room at PT attempt. Will f/u as able.  Aleda Grana, PT, DPT 10/21/21, 12:12 PM    Sandi Mariscal 10/21/2021, 12:11 PM

## 2021-10-21 NOTE — ED Notes (Signed)
Lab called to request phlebotomist ° °

## 2021-10-21 NOTE — Evaluation (Signed)
Occupational Therapy Evaluation Patient Details Name: Cynthia Hodge MRN: [id number] DOB: 1976/07/07 Today's Date: 10/21/2021   History of Present Illness Pt is a 45 y/o F admitted on 10/20/21 with c/o leg swelling & ulcerations. Pt reports she got into her bathtub nearly a month ago that had some residual Draino in it & her legs started to swell around that time. Pt reports painful ulcerations with srurrounding redness developed over the past 3-4 days. Pt is being treated for severe sepsis due to cellulitis of lower legs with multiple ulcers & BLE edema of unclear etiology.   Clinical Impression   Pt seen for OT evaluation this date in setting of acute hospitalization d/t cellulitis of lower extremities. She does require increased time for LB ADLs and demonstrates difficulty with transfers and fxl mobility, but does not require any hands-on assistance, only increased time. RW is used for fxl mobility for comfort to alleviate strain on already-tight skin on her feet, but is not required for balance. She is able to perform fxl mobility to/from restroom with MOD I, commode transfers and peri care with INDEP. She demos good tolerance considering her wounds. No further OT needs detected at this time. Pt left with all needs met and in reach.     Recommendations for follow up therapy are one component of a multi-disciplinary discharge planning process, led by the attending physician.  Recommendations may be updated based on patient status, additional functional criteria and insurance authorization.   Follow Up Recommendations  No OT follow up    Assistance Recommended at Discharge Set up Supervision/Assistance  Functional Status Assessment  Patient has not had a recent decline in their functional status  Equipment Recommendations  Tub/shower seat    Recommendations for Other Services       Precautions / Restrictions Precautions Precautions: None Restrictions Weight Bearing Restrictions: No       Mobility Bed Mobility Overal bed mobility: Modified Independent             General bed mobility comments: supine>sit    Transfers Overall transfer level: Modified independent Equipment used: None               General transfer comment: Pt able to CTS with no AD, RW utilized for comfort to alleviate stress to feet      Balance Overall balance assessment: Mild deficits observed, not formally tested                                         ADL either performed or assessed with clinical judgement   ADL Overall ADL's : Modified independent;At baseline                                       General ADL Comments: increased time, difficulty with LB ADLs, but no physical assist required     Vision Baseline Vision/History: 1 Wears glasses Patient Visual Report: No change from baseline       Perception     Praxis      Pertinent Vitals/Pain Pain Assessment: Faces Faces Pain Scale: Hurts little more Pain Location: BLE pain - reports no pain at rest but endorses increasing pain with movement & weight bearing, reports skin is tender Pain Descriptors / Indicators: Discomfort Pain Intervention(s): Limited activity within patient's tolerance;Monitored during session  Hand Dominance     Extremity/Trunk Assessment Upper Extremity Assessment Upper Extremity Assessment: Overall WFL for tasks assessed   Lower Extremity Assessment Lower Extremity Assessment: Overall WFL for tasks assessed (BLE edema & erythema, wound to medial aspect of L lower leg)   Cervical / Trunk Assessment Cervical / Trunk Assessment: Normal   Communication     Cognition Arousal/Alertness: Awake/alert Behavior During Therapy: WFL for tasks assessed/performed Overall Cognitive Status: Within Functional Limits for tasks assessed                                       General Comments       Exercises Other Exercises Other Exercises:  OT engages pt in ed re: role   Shoulder Instructions      Home Living Family/patient expects to be discharged to:: Private residence Living Arrangements: Alone Available Help at Discharge: Friend(s);Available PRN/intermittently Type of Home: House Home Access: Level entry     Home Layout: One level               Home Equipment: None          Prior Functioning/Environment Prior Level of Function : Independent/Modified Independent             Mobility Comments: Independent without AD, denies falls in the past 6 months. Reports she's had a friend assist her with obtaining meals this past week 2/2 worsening BLE.          OT Problem List: Decreased activity tolerance      OT Treatment/Interventions: Self-care/ADL training;Therapeutic activities    OT Goals(Current goals can be found in the care plan section) Acute Rehab OT Goals Patient Stated Goal: to get my legs healed in time for holidays OT Goal Formulation: All assessment and education complete, DC therapy  OT Frequency:     Barriers to D/C:            Co-evaluation              AM-PAC OT "6 Clicks" Daily Activity     Outcome Measure Help from another person eating meals?: None Help from another person taking care of personal grooming?: None Help from another person toileting, which includes using toliet, bedpan, or urinal?: None Help from another person bathing (including washing, rinsing, drying)?: None Help from another person to put on and taking off regular upper body clothing?: None Help from another person to put on and taking off regular lower body clothing?: None 6 Click Score: 24   End of Session Equipment Utilized During Treatment: Gait belt;Rolling walker (2 wheels) (RW for comfort only) Nurse Communication: Mobility status  Activity Tolerance: Patient tolerated treatment well Patient left: in bed;with call bell/phone within reach  OT Visit Diagnosis: Pain Pain - part of body:   (lower extremities)                Time: 6803-2122 OT Time Calculation (min): 21 min Charges:  OT General Charges $OT Visit: 1 Visit OT Evaluation $OT Eval Low Complexity: 1 Low OT Treatments $Self Care/Home Management : 8-22 mins  Gerrianne Scale, [place], OTR/L ascom [phone] 10/21/21, 4:52 PM

## 2021-10-21 NOTE — ED Notes (Signed)
Echo tech at bedside. Patient resting comfortably.

## 2021-10-22 LAB — ECHOCARDIOGRAM COMPLETE
AR max vel: 1.52 cm2
AV Area VTI: 1.56 cm2
AV Area mean vel: 1.4 cm2
AV Mean grad: 6 mmHg
AV Peak grad: 11.2 mmHg
Ao pk vel: 1.67 m/s
Area-P 1/2: 5.84 cm2
Calc EF: 51.4 %
Height: 65 in
S' Lateral: 3.35 cm
Single Plane A2C EF: 45.8 %
Single Plane A4C EF: 56.7 %
Weight: 4128 oz

## 2021-10-22 MED ORDER — VANCOMYCIN HCL 1000 MG/200ML IV SOLN
1000.0000 mg | Freq: Two times a day (BID) | INTRAVENOUS | Status: DC
  Administered 2021-10-23 (×2): 1000 mg via INTRAVENOUS
  Filled 2021-10-22 (×2): qty 200

## 2021-10-22 MED ORDER — SODIUM CHLORIDE 0.9 % IV SOLN
INTRAVENOUS | Status: DC | PRN
  Administered 2021-10-22 – 2021-10-23 (×4): 10 mL/h via INTRAVENOUS

## 2021-10-22 MED ORDER — VANCOMYCIN HCL 2000 MG/400ML IV SOLN
2000.0000 mg | Freq: Once | INTRAVENOUS | Status: AC
  Administered 2021-10-22: 13:00:00 2000 mg via INTRAVENOUS
  Filled 2021-10-22: qty 400

## 2021-10-22 NOTE — Progress Notes (Signed)
Patient blle wrapped lightly with gauze and 4x4 to help with drainage.  Drainage from open areas is serous in color.  Will rewrap as needed.
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
## 2021-10-22 NOTE — Progress Notes (Signed)
[facility] at [place] NAME: Cynthia Hodge    MR#:  [id number]  DATE OF BIRTH:  1976/05/27  SUBJECTIVE:  patient came in with significant blisters and painful lesions in both her lower extremity. She attributed started after some exposure to drain all few days ago. She denies using any new detergent or soap or any lotion. Complains of swelling in both lower extremity for last few days. Increasing shortness of breath.  Denies taking any medications or follow-up with PCP.  REVIEW OF SYSTEMS:   Review of Systems  Constitutional:  Negative for chills, fever and weight loss.  HENT:  Negative for ear discharge, ear pain and nosebleeds.   Eyes:  Negative for blurred vision, pain and discharge.  Respiratory:  Positive for shortness of breath. Negative for sputum production, wheezing and stridor.   Cardiovascular:  Positive for leg swelling. Negative for chest pain, palpitations, orthopnea and PND.  Gastrointestinal:  Negative for abdominal pain, diarrhea, nausea and vomiting.  Genitourinary:  Negative for frequency and urgency.  Musculoskeletal:  Negative for back pain and joint pain.  Skin:  Positive for rash.  Neurological:  Negative for sensory change, speech change, focal weakness and weakness.  Psychiatric/Behavioral:  Negative for depression and hallucinations. The patient is not nervous/anxious.   Tolerating Diet:yes Tolerating PT:   DRUG ALLERGIES:  No Known Allergies  VITALS:  Blood pressure (!) 159/99, pulse 78, temperature 97.6 F (36.4 C), resp. rate 18, height 5\' 5"  (1.651 m), weight 114.5 kg, SpO2 96 %.  PHYSICAL EXAMINATION:   Physical Exam  GENERAL:  45 y.o.-year-old patient lying in the bed with no acute distress. obese HEENT: Head atraumatic, normocephalic. Oropharynx and nasopharynx clear.  LUNGS: Normal breath sounds bilaterally, no wheezing, rales, rhonchi. No use of accessory muscles of respiration.  CARDIOVASCULAR: S1,  S2 normal. No murmurs, rubs, or gallops.  ABDOMEN: Soft, nontender, nondistended. Bowel sounds present. No organomegaly or mass.  EXTREMITIES:    NEUROLOGIC: nonfocal PSYCHIATRIC:  patient is alert and oriented x 3.  SKIN: as above  LABORATORY PANEL:  CBC Recent Labs  Lab 10/21/21 0843  WBC 11.6*  HGB 10.4*  HCT 35.5*  PLT 395    Chemistries  Recent Labs  Lab 10/20/21 1030 10/21/21 0500  NA 135 139  K 3.6 3.8  CL 103 100  CO2 24 26  GLUCOSE 111* 82  BUN 16 16  CREATININE 0.94 0.82  CALCIUM 8.7* 8.4*  MG  --  2.2  AST 45*  --   ALT 34  --   ALKPHOS 80  --   BILITOT 2.1*  --    Cardiac Enzymes No results for input(s): TROPONINI in the last 168 hours. RADIOLOGY:  US Abdomen Complete  Result Date: 10/21/2021 CLINICAL DATA:  Abdominal tenderness for 1 day. EXAM: ABDOMEN ULTRASOUND COMPLETE COMPARISON:  None. FINDINGS: Gallbladder: No gallbladder wall thickening visualized. Question 8.5 mm calculus in the dependent portion of the gallbladder. No sonographic Murphy sign noted by sonographer. Common bile duct: Diameter: 4.2 mm Liver: No focal lesion identified. Diffusely increased parenchymal echogenicity. Portal vein is patent on color Doppler imaging with normal direction of blood flow towards the liver. IVC: No abnormality visualized. Pancreas: Obscured by gas. Spleen: Size and appearance within normal limits. Right Kidney: Length: 12.1 cm. Echogenicity within normal limits. No mass or hydronephrosis visualized. Left Kidney: Length: 11.3 cm. Echogenicity within normal limits. No mass or hydronephrosis visualized. Abdominal aorta: No aneurysm visualized. Other findings: Small  to moderate ascites, most noticeable in perihepatic location. IMPRESSION: Small to moderate abdominopelvic ascites. Questionable cholelithiasis without sonographic evidence of cholecystitis. Diffusely increased echogenicity of the liver usually associated with intrinsic liver disease. Electronically Signed    By: Ted Mcalpine M.D.   On: 10/21/2021 13:16   ECHOCARDIOGRAM COMPLETE  Result Date: 10/22/2021    ECHOCARDIOGRAM REPORT   Patient Name:   Cynthia Hodge Date of Exam: 10/21/2021 Medical Rec #:  [id number]        Height:       65.0 in Accession #:    [id number]       Weight:       258.0 lb Date of Birth:  02/15/1976        BSA:          2.204 m Patient Age:    45 years         BP:           127/64 mmHg Patient Gender: F                HR:           98 bpm. Exam Location:  [facility] Procedure: 2D Echo Indications:     Diastolic CHF  History:         Patient has no prior history of Echocardiogram examinations.                  Risk Factors:Morbid Obesity.  Sonographer:     L Thornton-Maynard Referring Phys:  [id number] Brien Few NIU Diagnosing Phys: Alwyn Pea MD IMPRESSIONS  1. Left ventricular ejection fraction, by estimation, is 50 to 55%. The left ventricle has low normal function. The left ventricle has no regional wall motion abnormalities. There is mild left ventricular hypertrophy. Left ventricular diastolic parameters are consistent with Grade II diastolic dysfunction (pseudonormalization).  2. Right ventricular systolic function is normal. The right ventricular size is normal. There is moderately elevated pulmonary artery systolic pressure.  3. The mitral valve is normal in structure. Mild mitral valve regurgitation.  4. Tricuspid valve regurgitation is moderate.  5. The aortic valve is grossly normal. Aortic valve regurgitation is trivial. FINDINGS  Left Ventricle: Left ventricular ejection fraction, by estimation, is 50 to 55%. The left ventricle has low normal function. The left ventricle has no regional wall motion abnormalities. The left ventricular internal cavity size was normal in size. There is mild left ventricular hypertrophy. Left ventricular diastolic parameters are consistent with Grade II diastolic dysfunction (pseudonormalization). Right Ventricle: The right ventricular size is normal. No  increase in right ventricular wall thickness. Right ventricular systolic function is normal. There is moderately elevated pulmonary artery systolic pressure. The tricuspid regurgitant velocity is 3.62 m/s, and with an assumed right atrial pressure of 3 mmHg, the estimated right ventricular systolic pressure is 55.4 mmHg. Left Atrium: Left atrial size was normal in size. Right Atrium: Right atrial size was normal in size. Pericardium: There is no evidence of pericardial effusion. Mitral Valve: The mitral valve is normal in structure. Mild mitral valve regurgitation. Tricuspid Valve: The tricuspid valve is normal in structure. Tricuspid valve regurgitation is moderate. Aortic Valve: The aortic valve is grossly normal. Aortic valve regurgitation is trivial. Aortic valve mean gradient measures 6.0 mmHg. Aortic valve peak gradient measures 11.2 mmHg. Aortic valve area, by VTI measures 1.56 cm. Pulmonic Valve: The pulmonic valve was normal in structure. Pulmonic valve regurgitation is not visualized. Aorta: The ascending aorta was not well visualized. IAS/Shunts: The  atrial septum is grossly normal. Additional Comments: There is no pleural effusion.  LEFT VENTRICLE PLAX 2D LVIDd:         4.49 cm     Diastology LVIDs:         3.35 cm     LV e' medial:    6.85 cm/s LV PW:         1.18 cm     LV E/e' medial:  16.6 LV IVS:        1.43 cm     LV e' lateral:   5.66 cm/s LVOT diam:     1.70 cm     LV E/e' lateral: 20.1 LV SV:         44 LV SV Index:   20 LVOT Area:     2.27 cm  LV Volumes (MOD) LV vol d, MOD A2C: 54.4 ml LV vol d, MOD A4C: 70.4 ml LV vol s, MOD A2C: 29.5 ml LV vol s, MOD A4C: 30.5 ml LV SV MOD A2C:     24.9 ml LV SV MOD A4C:     70.4 ml LV SV MOD BP:      32.5 ml RIGHT VENTRICLE RV S prime:     10.30 cm/s TAPSE (M-mode): 1.5 cm LEFT ATRIUM             Index        RIGHT ATRIUM           Index LA diam:        4.00 cm 1.81 cm/m   RA Area:     22.80 cm LA Vol (A2C):   77.5 ml 35.16 ml/m  RA Volume:   69.00 ml   31.30 ml/m LA Vol (A4C):   69.7 ml 31.62 ml/m LA Biplane Vol: 75.7 ml 34.34 ml/m  AORTIC VALVE                     PULMONIC VALVE AV Area (Vmax):    1.52 cm      PV Vmax:       0.86 m/s AV Area (Vmean):   1.40 cm      PV Peak grad:  2.9 mmHg AV Area (VTI):     1.56 cm AV Vmax:           167.00 cm/s AV Vmean:          117.000 cm/s AV VTI:            0.284 m AV Peak Grad:      11.2 mmHg AV Mean Grad:      6.0 mmHg LVOT Vmax:         112.00 cm/s LVOT Vmean:        72.200 cm/s LVOT VTI:          0.195 m LVOT/AV VTI ratio: 0.69  AORTA Ao Root diam: 3.10 cm Ao Asc diam:  3.10 cm MITRAL VALVE                TRICUSPID VALVE MV Area (PHT): 5.84 cm     TR Peak grad:   52.4 mmHg MV Decel Time: 130 msec     TR Vmax:        362.00 cm/s MV E velocity: 114.00 cm/s MV A velocity: 51.40 cm/s   SHUNTS MV E/A ratio:  2.22         Systemic VTI:  0.20 m  Systemic Diam: 1.70 cm Cynthia Kida MD Electronically signed by Cynthia Kida MD Signature Date/Time: 10/22/2021/9:37:11 AM    Final    ASSESSMENT AND PLAN:   Cynthia Hodge is a 45 y.o. female without significant past medical history, who presents with bilateral leg swelling, pain and leg ulcers.  Pt states that she developed ulcers in both legs after she got into a bath that had recently been cleaned with Drano and bleach about 2 weeks ago. She feels like it burned her skin. She also noted bilateral leg edema which has been progressively worsening recently.  Severe sepsis due to cellulitis of left lower legs with multiple ulcers:  ?Vasculitis (HTN,Leg edema, proteinuria and skin lesions) Patient admits critical for sepsis with tachycardia with heart rate of 124, RR 26.  Lactic acid is elevated 2.4.  Patient also has mild leukocytosis.  --sepsis resolved - - IV Rocephin +vancomycin - PRN Zofran for nausea, ibuprofen and oxycodone for pain - Blood cultures x 2 --negative - ID consulted-- Dr. Delaine Lame to see  patient --vasculitis w/u labs ordered -- consider nephrology and rheumatology consultation as well   Bilateral leg edema Acute CHF, diastolic--new HTN, uncontrolled --Lower venous Dopplers negative for DVT.   --Patient has elevated BNP of 569, cardiomegaly and interstitial edema chest x-ray, concerning for CHF.   -Patient was given 40 mg of Lasix bid -2D echo EF of 50 to 55%. Moderately elevated right pulmonary arterial pressure. Moderate TR. -- Will consider cardiology consultation   Mildly abnormal LFTs: May be due to congestion -Avoid using Tylenol -Check HIV antibody -check hepatitis panel   Hypertensive urgency: Denies history of hypertension. Came in with Blood pressure 244/141 -IV hydralazine as needed -on BB  Generalized anxiety disorder: consulted [place], Jame Lord. -started gabapentin 100 mg 3 times daily     DVT ppx: SQ Lovenox Code Status: Full code Family Communication: not done, no family member is at bed side.        Disposition Plan:  Anticipate discharge back to previous environment Consults called:  ID Admission status and Level of care: Progressive:   as inpt             Procedures: Family communication :none Consults :ID CODE STATUS: Full DVT Prophylaxis :Lovenox Level of care: Progressive Status is: Inpatient  Remains inpatient appropriate because: CHF, HTN        TOTAL TIME TAKING CARE OF THIS PATIENT: 35 minutes.  >50% time spent on counselling and coordination of care  Note: This dictation was prepared with Dragon dictation along with smaller phrase technology. Any transcriptional errors that result from this process are unintentional.  Cynthia Hodge M.D    [facility]   CC: Primary care physician; Pcp, No Patient ID: BEE ROTI, female   DOB: 1976/10/22, 45 y.o.   MRN: [id number]

## 2021-10-22 NOTE — Progress Notes (Signed)
Pharmacy Antibiotic Note  Cynthia Hodge is a 45 y.o. female admitted on 10/20/2021 with bilateral LE cellulitis w/ ulceration.  Pharmacy has been consulted for vancomycin dosing. Renal function is stable and at apparent baseline.  Plan: start vancomycin 2000 mg IV x 1, then 1000 mg IV every 12 hours  Goal AUC 400-550 Expected AUC: 515.7 SCr used: 0.82 mg/dL Ke: 9.983J-8, S5/0: 53.9 h Daily renal function assessment while on IV vancomycin Levels as clinically indicated   Height: 5\' 5"  (165.1 cm) Weight: 114.5 kg (252 lb 6.4 oz) IBW/kg (Calculated) : 57  Temp (24hrs), Avg:97.9 F (36.6 C), Min:97.5 F (36.4 C), Max:98.9 F (37.2 C)  Recent Labs  Lab 10/20/21 1030 10/20/21 1530 10/20/21 1614 10/20/21 2125 10/21/21 0500 10/21/21 0843 10/21/21 1525  WBC 11.8*  --   --   --   --  11.6*  --   CREATININE 0.94  --   --   --  0.82  --   --   LATICACIDVEN 2.4* 2.1* 2.4* 1.8  --  2.4* 1.8    Estimated Creatinine Clearance: 107.1 mL/min (by C-G formula based on SCr of 0.82 mg/dL).    No Known Allergies  Antimicrobials this admission: 12/02 ceftriaxone >>  12/04 vancomycin >>   Microbiology results: 12/02 BCx: NG x 2 days 12/04 WCx: pending  12/03 MRSA PCR: positive  Thank you for allowing pharmacy to be a part of this patient's care.  14/03 10/22/2021 11:06 AM

## 2021-10-23 LAB — SJOGRENS SYNDROME-A EXTRACTABLE NUCLEAR ANTIBODY

## 2021-10-23 LAB — ENA+DNA/DS+ANTICH+CENTRO+JO...
Anti JO-1: <0.2 AI (ref 0.0–0.9)
Centromere Ab Screen: <0.2 AI (ref 0.0–0.9)
Chromatin Ab SerPl-aCnc: <0.2 AI (ref 0.0–0.9)
ENA SM Ab Ser-aCnc: <0.2 AI (ref 0.0–0.9)
Ribonucleic Protein: <0.2 AI (ref 0.0–0.9)
SSA (Ro) (ENA) Antibody, IgG: <0.2 AI (ref 0.0–0.9)
SSB (La) (ENA) Antibody, IgG: 2.1 AI — ABNORMAL HIGH (ref 0.0–0.9)
Scleroderma (Scl-70) (ENA) Antibody, IgG: <0.2 AI (ref 0.0–0.9)
ds DNA Ab: 61 IU/mL — ABNORMAL HIGH (ref 0–9)

## 2021-10-23 LAB — ANA W/REFLEX IF POSITIVE: Anti Nuclear Antibody (ANA): POSITIVE — AB

## 2021-10-23 LAB — RHEUMATOID FACTOR: Rheumatoid fact SerPl-aCnc: <10 IU/mL (ref <14.0)

## 2021-10-23 LAB — CREATININE, SERUM
Creatinine, Ser: 1.07 mg/dL — ABNORMAL HIGH (ref 0.44–1.00)
GFR, Estimated: >60 mL/min (ref >60)

## 2021-10-23 LAB — TSH: TSH: 1.498 u[IU]/mL (ref 0.350–4.500)

## 2021-10-23 LAB — HEPATITIS PANEL, ACUTE
HCV Ab: NONREACTIVE
Hep A IgM: NONREACTIVE
Hep B C IgM: NONREACTIVE
Hepatitis B Surface Ag: NONREACTIVE

## 2021-10-23 LAB — SJOGRENS SYNDROME-B EXTRACTABLE NUCLEAR ANTIBODY

## 2021-10-23 LAB — ANTI-SMITH ANTIBODY

## 2021-10-23 LAB — ANTI-DNA ANTIBODY, DOUBLE-STRANDED

## 2021-10-23 MED ORDER — LISINOPRIL 5 MG PO TABS
5.0000 mg | ORAL_TABLET | Freq: Every day | ORAL | Status: DC
  Administered 2021-10-23: 5 mg via ORAL
  Filled 2021-10-23 (×2): qty 1

## 2021-10-23 MED ORDER — VANCOMYCIN HCL 750 MG/150ML IV SOLN
750.0000 mg | Freq: Two times a day (BID) | INTRAVENOUS | Status: AC
  Administered 2021-10-24 (×3): 750 mg via INTRAVENOUS
  Filled 2021-10-23 (×3): qty 150

## 2021-10-23 MED ORDER — PHENOL 1.4 % MT LIQD
1.0000 | OROMUCOSAL | Status: DC | PRN
  Filled 2021-10-23: qty 177

## 2021-10-23 NOTE — Progress Notes (Signed)
Pharmacy Antibiotic Note  Cynthia Hodge is a 45 y.o. female admitted on 10/20/2021 with bilateral LE cellulitis w/ ulceration.  Pharmacy was consulted for vancomycin dosing. Renal function has decreased overnight based on SCr  Plan: adjust vancomycin dose to 750 mg IV every 12 hours  Goal AUC 400-550 Expected AUC: 502.3 SCr used: 1.07 mg/dL Daily renal function assessment while on IV vancomycin Levels as clinically indicated   Height: 5\' 5"  (165.1 cm) Weight: 112.8 kg (248 lb 9.6 oz) IBW/kg (Calculated) : 57  Temp (24hrs), Avg:97.7 F (36.5 C), Min:97.6 F (36.4 C), Max:97.9 F (36.6 C)  Recent Labs  Lab 10/20/21 1030 10/20/21 1530 10/20/21 1614 10/20/21 2125 10/21/21 0500 10/21/21 0843 10/21/21 1525  WBC 11.8*  --   --   --   --  11.6*  --   CREATININE 0.94  --   --   --  0.82  --   --   LATICACIDVEN 2.4* 2.1* 2.4* 1.8  --  2.4* 1.8     Estimated Creatinine Clearance: 106.2 mL/min (by C-G formula based on SCr of 0.82 mg/dL).    No Known Allergies  Antimicrobials this admission: 12/02 ceftriaxone >>  12/04 vancomycin >>   Microbiology results: 12/02 BCx: NG x 3 days 12/04 WCx: S aureus (sensitivities pending) 12/03 MRSA PCR: positive  Thank you for allowing pharmacy to be a part of this patient's care.  14/03 10/23/2021 7:40 AM

## 2021-10-23 NOTE — Progress Notes (Signed)
[place] at [place] NAME: Cynthia Hodge    MR#:  [id number]  DATE OF BIRTH:  February 07, 1976  SUBJECTIVE:  patient came in with significant blisters and painful lesions in both her lower extremity. She attributed started after some exposure to drain all few days ago. She denies using any new detergent or soap or any lotion. Complains of swelling in both lower extremity for last few days. Increasing shortness of breath. Patient overall feels bit better. She is now on room air. Breathing much better. Sats 92 to 97% on room air. REVIEW OF SYSTEMS:   Review of Systems  Constitutional:  Negative for chills, fever and weight loss.  HENT:  Negative for ear discharge, ear pain and nosebleeds.   Eyes:  Negative for blurred vision, pain and discharge.  Respiratory:  Positive for shortness of breath. Negative for sputum production, wheezing and stridor.   Cardiovascular:  Positive for leg swelling. Negative for chest pain, palpitations, orthopnea and PND.  Gastrointestinal:  Negative for abdominal pain, diarrhea, nausea and vomiting.  Genitourinary:  Negative for frequency and urgency.  Musculoskeletal:  Negative for back pain and joint pain.  Skin:  Positive for rash.  Neurological:  Negative for sensory change, speech change, focal weakness and weakness.  Psychiatric/Behavioral:  Negative for depression and hallucinations. The patient is not nervous/anxious.   Tolerating Diet:yes Tolerating PT: self ambulatory  DRUG ALLERGIES:  No Known Allergies  VITALS:  Blood pressure 139/71, pulse 77, temperature 98 F (36.7 C), resp. rate 17, height 5\' 5"  (1.651 m), weight 112.8 kg, SpO2 92 %.  PHYSICAL EXAMINATION:   Physical Exam  GENERAL:  45 y.o.-year-old patient lying in the bed with no acute distress. obese HEENT: Head atraumatic, normocephalic. Oropharynx and nasopharynx clear.  LUNGS: Normal breath sounds bilaterally, no wheezing, rales, rhonchi. No  use of accessory muscles of respiration.  CARDIOVASCULAR: S1, S2 normal. No murmurs, rubs, or gallops.  ABDOMEN: Soft, nontender, nondistended. Bowel sounds present. No organomegaly or mass.  EXTREMITIES:    NEUROLOGIC: nonfocal PSYCHIATRIC:  patient is alert and oriented x 3.  SKIN: as above  LABORATORY PANEL:  CBC Recent Labs  Lab 10/21/21 0843  WBC 11.6*  HGB 10.4*  HCT 35.5*  PLT 395     Chemistries  Recent Labs  Lab 10/20/21 1030 10/21/21 0500 10/23/21 0612  NA 135 139  --   K 3.6 3.8  --   CL 103 100  --   CO2 24 26  --   GLUCOSE 111* 82  --   BUN 16 16  --   CREATININE 0.94 0.82 1.07*  CALCIUM 8.7* 8.4*  --   MG  --  2.2  --   AST 45*  --   --   ALT 34  --   --   ALKPHOS 80  --   --   BILITOT 2.1*  --   --     Cardiac Enzymes No results for input(s): TROPONINI in the last 168 hours. RADIOLOGY:  No results found. ASSESSMENT AND PLAN:   Cynthia Hodge is a 45 y.o. female without significant past medical history, who presents with bilateral leg swelling, pain and leg ulcers.  Pt states that she developed ulcers in both legs after she got into a bath that had recently been cleaned with Drano and bleach about 2 weeks ago. She feels like it burned her skin. She also noted bilateral leg edema which has been progressively worsening  recently.  Severe sepsis due to cellulitis of left lower legs with multiple ulcers:  ?Vasculitis (HTN,Leg edema, proteinuria and skin lesions) Patient admits critical for sepsis with tachycardia with heart rate of 124, RR 26.  Lactic acid is elevated 2.4.  Patient also has mild leukocytosis.  --sepsis resolved - - IV Rocephin +vancomycin - PRN Zofran for nausea, ibuprofen and oxycodone for pain - Blood cultures x 2 --negative - ID consulted-- Dr. Rivka Safer to see patient --vasculitis w/u labs ordered -- consider nephrology and rheumatology consultation as well --12/5-- called three dermatology practices in town. None of the  MDs come for skin biopsy. I have asked surgery Dr Tonna Boehringer to see if they can skin biopsy. -- Case discussed with Dr. Gavin Potters rheumatology. Recommends skin biopsy.   Bilateral leg edema Acute CHF, diastolic--new HTN, uncontrolled --Lower venous Dopplers negative for DVT.   --Patient has elevated BNP of 569, cardiomegaly and interstitial edema chest x-ray, concerning for CHF.   -Patient was given 40 mg of Lasix bid -2D echo EF of 50 to 55%. Moderately elevated right pulmonary arterial pressure. Moderate TR. -- good urine output. Creatinine better.   Mildly abnormal LFTs: May be due to congestion -Avoid using Tylenol -Check HIV antibody -check hepatitis panel   Hypertensive urgency: Denies history of hypertension. Came in with Blood pressure 244/141 -IV hydralazine as needed -on BB -- added low-dose lisinopril  Generalized anxiety disorder: consulted [facility], Jame Lord. -started gabapentin 100 mg 3 times daily     DVT ppx: SQ Lovenox Code Status: Full code Family Communication: not done, no family member is at bed side.        Disposition Plan:  Anticipate discharge back to previous environment Consults called:  ID, surgery Admission status and Level of care: Progressive:   as inpt             Procedures: Family communication :none Consults :ID CODE STATUS: Full DVT Prophylaxis :Lovenox Level of care: Med-Surg Status is: Inpatient  Remains inpatient appropriate because: CHF, HTN, bilateral lower extremity painful skin lesions        TOTAL TIME TAKING CARE OF THIS PATIENT: 25 minutes.  >50% time spent on counselling and coordination of care  Note: This dictation was prepared with Dragon dictation along with smaller phrase technology. Any transcriptional errors that result from this process are unintentional.  Cynthia Hodge M.D    [facility]   CC: Primary care physician; Pcp, No Patient ID: Cynthia Hodge, female   DOB: [birth year], 45 y.o.   MRN:  [id number]

## 2021-10-23 NOTE — Consult Note (Signed)
WOC Nurse Consult Note: Patient receiving care in [facility]. Reason for Consult: BLE wounds Wound type: infectious versus vasculitic. Patient is positive for MRSA of the nares. Pressure Injury POA: Yes/No/NA Measurement: too numerous to measure; see photos Wound bed: most are open and pink, some have greyish slough Drainage (amount, consistency, odor) yellow Periwound: erythematous Dressing procedure/placement/frequency: Today I performed the following care: Wash each lower leg with one CHG wipe. Place Aquacel Advantage [place] [phone]) over the weeping and wound areas (will likely take at least 3 per leg). Then cover with ABD pads (at least 2 per anterior leg). Beginning behind the toes and going to just below the knees, spiral wrap kerlix. Change daily and when soiled/wet with secretions.  Next scheduled dressing change is tomorrow; however, bedside nurse should perform if existing dressings become saturated/soiled.  Monitor the wound area(s) for worsening of condition such as: Signs/symptoms of infection,  Increase in size,  Development of or worsening of odor, Development of pain, or increased pain at the affected locations.  Notify the medical team if any of these develop.  Thank you for the consult.  Discussed plan of care with the patient.  WOC nurse will not follow at this time.  Please re-consult the WOC team if needed.  Helmut Muster, RN, MSN, CWOCN, CNS-BC, pager [phone]

## 2021-10-23 NOTE — Progress Notes (Signed)
Patient dressing done to bilateral lower extremities as per order.

## 2021-10-24 DIAGNOSIS — A4902 Methicillin resistant Staphylococcus aureus infection, unspecified site: Secondary | ICD-10-CM

## 2021-10-24 LAB — HCV AB W REFLEX TO QUANT PCR: HCV Ab: 0.1 s/co ratio (ref 0.0–0.9)

## 2021-10-24 LAB — AEROBIC CULTURE W GRAM STAIN (SUPERFICIAL SPECIMEN): Gram Stain: NONE SEEN

## 2021-10-24 LAB — PROTEIN / CREATININE RATIO, URINE
Creatinine, Urine: 52 mg/dL
Protein Creatinine Ratio: 0.27 mg/mg{Cre} — ABNORMAL HIGH (ref 0.00–0.15)
Total Protein, Urine: 14 mg/dL

## 2021-10-24 LAB — HEPATITIS B VIRUS (PROFILE VI)

## 2021-10-24 LAB — ANCA TITERS
Atypical P-ANCA titer: <1:20 {titer}
C-ANCA: <1:20 {titer}
P-ANCA: <1:20 {titer}

## 2021-10-24 LAB — HCV INTERPRETATION

## 2021-10-24 LAB — CREATININE, SERUM
Creatinine, Ser: 1.02 mg/dL — ABNORMAL HIGH (ref 0.44–1.00)
GFR, Estimated: >60 mL/min (ref >60)

## 2021-10-24 LAB — C4 COMPLEMENT: Complement C4, Body Fluid: 27 mg/dL (ref 12–38)

## 2021-10-24 LAB — C3 COMPLEMENT: C3 Complement: 144 mg/dL (ref 82–167)

## 2021-10-24 MED ORDER — HYDRALAZINE HCL 25 MG PO TABS
25.0000 mg | ORAL_TABLET | Freq: Three times a day (TID) | ORAL | Status: DC
  Administered 2021-10-24 (×2): 25 mg via ORAL
  Filled 2021-10-24 (×2): qty 1

## 2021-10-24 MED ORDER — LINEZOLID 600 MG PO TABS
600.0000 mg | ORAL_TABLET | Freq: Two times a day (BID) | ORAL | Status: DC
  Administered 2021-10-25 – 2021-10-26 (×3): 600 mg via ORAL
  Filled 2021-10-24 (×4): qty 1

## 2021-10-24 MED ORDER — LISINOPRIL 10 MG PO TABS
10.0000 mg | ORAL_TABLET | Freq: Every day | ORAL | Status: DC
  Administered 2021-10-24 – 2021-10-25 (×2): 10 mg via ORAL
  Filled 2021-10-24 (×2): qty 1

## 2021-10-24 MED ORDER — HYDRALAZINE HCL 50 MG PO TABS
50.0000 mg | ORAL_TABLET | Freq: Three times a day (TID) | ORAL | Status: DC
  Administered 2021-10-24 – 2021-10-25 (×3): 50 mg via ORAL
  Filled 2021-10-24 (×3): qty 1

## 2021-10-24 NOTE — Plan of Care (Signed)

## 2021-10-24 NOTE — Consult Note (Signed)
Subjective:   CC: leg lesions  HPI:  Cynthia Hodge is a 45 y.o. female who was consulted by Allena Katz for evaluation of above.  First noted a few days ago.  Symptoms include: Pain is sharp, with new lesions on leg bilaterally.  Exacerbated by nothing.  Alleviated by noting.  Associated with swelling and redness around lesions.  Concerns for vasculitis so surgery consult placed for skin biopsy.     Past Medical History: none reported  Past Surgical History: none reported  Family History: family history includes Diabetes in her paternal uncle.  Social History:  reports that she has never smoked. She has never used smokeless tobacco. She reports that she does not currently use alcohol. She reports that she does not use drugs.  Current Medications:  Prior to Admission medications   Not on File    Allergies:  No Known Allergies  ROS:  General: Denies weight loss, weight gain, fatigue, fevers, chills, and night sweats. Eyes: Denies blurry vision, double vision, eye pain, itchy eyes, and tearing. Ears: Denies hearing loss, earache, and ringing in ears. Nose: Denies sinus pain, congestion, infections, runny nose, and nosebleeds. Mouth/throat: Denies hoarseness, sore throat, bleeding gums, and difficulty swallowing. Heart: Denies chest pain, palpitations, racing heart, irregular heartbeat, leg pain or swelling, and decreased activity tolerance. Respiratory: Denies breathing difficulty, shortness of breath, wheezing, cough, and sputum. GI: Denies change in appetite, heartburn, nausea, vomiting, constipation, diarrhea, and blood in stool. GU: Denies difficulty urinating, pain with urinating, urgency, frequency, blood in urine. Musculoskeletal: Denies joint stiffness, pain, swelling, muscle weakness. Skin: Denies rash, itching, mass, tumors, sores, and boils Neurologic: Denies headache, fainting, dizziness, seizures, numbness, and tingling. Psychiatric: Denies depression, anxiety, difficulty  sleeping, and memory loss. Endocrine: Denies heat or cold intolerance, and increased thirst or urination. Blood/lymph: Denies easy bruising, easy bruising, and swollen glands     Objective:     BP (!) 161/97 (BP Location: Left Arm)   Pulse 79   Temp 97.7 F (36.5 C)   Resp 17   Ht 5\' 5"  (1.651 m)   Wt 112.8 kg   SpO2 96%   BMI 41.37 kg/m   Constitutional :  alert, cooperative, appears stated age, and no distress  Lymphatics/Throat:  no asymmetry, masses, or scars  Respiratory:  clear to auscultation bilaterally  Cardiovascular:  regular rate and rhythm  Gastrointestinal: soft, non-tender; bowel sounds normal; no masses,  no organomegaly.  Musculoskeletal: Steady gait and movement  Skin: Cool and moist, see pic below.  Psychiatric: Normal affect, non-agitated, not confused          LABS:  CMP Latest Ref Rng & Units 10/24/2021 10/23/2021 10/21/2021  Glucose 70 - 99 mg/dL - - 82  BUN 6 - 20 mg/dL - - 16  Creatinine 14/01/2021 - 1.00 mg/dL 1.06) 2.69(S) 8.54(O  Sodium 135 - 145 mmol/L - - 139  Potassium 3.5 - 5.1 mmol/L - - 3.8  Chloride 98 - 111 mmol/L - - 100  CO2 22 - 32 mmol/L - - 26  Calcium 8.9 - 10.3 mg/dL - - 8.4(L)  Total Protein 6.5 - 8.1 g/dL - - -  Total Bilirubin 0.3 - 1.2 mg/dL - - -  Alkaline Phos 38 - 126 U/L - - -  AST 15 - 41 U/L - - -  ALT 0 - 44 U/L - - -   CBC Latest Ref Rng & Units 10/21/2021 10/20/2021  WBC 4.0 - 10.5 K/uL 11.6(H) 11.8(H)  Hemoglobin 12.0 -  15.0 g/dL 10.4(L) 10.8(L)  Hematocrit 36.0 - 46.0 % 35.5(L) 36.8  Platelets 150 - 400 K/uL 395 433(H)    RADS: N/a  Assessment:      Bilateral leg lesions, with concern for vasculitis.  Requested skin biopsy.  Procedure noted below.  Plan:     1. Alternatives include continued observation.  Benefits include possible pathologic evaluation. Discussed the risk of biopsy including recurrence, chronic pain, post-op infxn, poor cosmesis, poor/delayed wound healing, and possible re-operation to  address said risks.   Pre-Op Dx: leg lesions Post-Op Dx: same Anesthesia: local EBL: 18mL Complications:  none apparent Specimen: left leg skin lesion Procedure: incisional biopsy of Surgeon: Lysle Pearl  Indications for procedure: See above  Description of Procedure:  Consent obtained, time out performed.  Patient placed in supine position.  Area sterilized and draped in usual position.  Local infused to area previously marked.  3 elliptical cm incision made through dermis down to subq with 15blade around one of the lesions with surrounding erythema and portion of normal skin within the ellipse.  The tissue removed from surrounding tissue completely using knife, passed off field pending pathology.  Wound hemostasis noted, then closed with interrupted 3-0 nylon.  Wound then dressed with 4x4 gauze and tape.  Pt tolerated procedure well.  F/u office in one week for suture removal.  Keep area covered for 48hrs, then ok to remove dressing and shower.
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
## 2021-10-24 NOTE — Progress Notes (Signed)
Patient blood pressure is 178/92 she did not want to take IV labetalol she said that it did not work that well to lower her b/p. Patient would rather wait to take po a.m medications.

## 2021-10-24 NOTE — Discharge Instructions (Signed)
Left leg skin biopsy: Followup in office one week after procedure for suture removal.  Keep area covered for 48hrs after procedure, then ok to remove dressing and shower.  No need to keep sutures covered with dressing
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
## 2021-10-24 NOTE — TOC Progression Note (Signed)
Transition of Care [facility]) - Progression Note    Patient Details  Name: Cynthia Hodge MRN: [id number] Date of Birth: 1976-07-29  Transition of Care [facility]) CM/SW Contact  Marlowe Sax, RN Phone Number: 10/24/2021, 1:11 PM  Clinical Narrative:    Lives alone, she has transportation, I provided her with [facility] application as she does not have Ins and no PCP, I sent the referral thru Email, I explained to her to fill out the application and call the number for an appointment, She will get her medication at [facility] [place], [facility] to follow for additional needs        Expected Discharge Plan and Services                                                 Social Determinants of Health (SDOH) Interventions    Readmission Risk Interventions No flowsheet data found.

## 2021-10-24 NOTE — Progress Notes (Signed)
   Date of Admission:  10/20/2021    ID: Cynthia Hodge is a 45 y.o. female Principal Problem:   Cellulitis of left lower leg Active Problems:   Bilateral leg edema   Severe sepsis (HCC)   Abnormal LFTs   Hypertensive urgency   Generalized anxiety disorder    Subjective: Doing better Leg discharge from wounds much improved Pain better   Medications:   bisacodyl  10 mg Rectal Once   Chlorhexidine Gluconate Cloth  6 each Topical Q0600   enoxaparin (LOVENOX) injection  0.5 mg/kg Subcutaneous Q24H   furosemide  40 mg Intravenous BID   gabapentin  100 mg Oral TID   hydrALAZINE  25 mg Oral Q8H   lisinopril  10 mg Oral Daily   metoprolol tartrate  25 mg Oral BID   mupirocin ointment  1 application Nasal BID    Objective: Vital signs in last 24 hours: Temp:  [97.7 F (36.5 C)-98.2 F (36.8 C)] 97.7 F (36.5 C) (12/06 1223) Pulse Rate:  [79-96] 79 (12/06 1223) Resp:  [16-18] 17 (12/06 1223) BP: (132-197)/(76-100) 161/97 (12/06 1223) SpO2:  [93 %-97 %] 96 % (12/06 1223)  PHYSICAL EXAM:  General: Alert, cooperative, no distress, appears stated age.  Head: Normocephalic, without obvious abnormality, atraumatic. Eyes: Conjunctivae clear, anicteric sclerae. Pupils are equal ENT Nares normal. No drainage or sinus tenderness. Lips, mucosa, and tongue normal. No Thrush Neck: Supple, symmetrical, no adenopathy, thyroid: non tender no carotid bruit and no JVD. Back: No CVA tenderness. Lungs: Clear to auscultation bilaterally. No Wheezing or Rhonchi. No rales. Heart: Regular rate and rhythm, no murmur, rub or gallop. Abdomen: Soft distended- abd wall edema Extremities: edema legs Skin: legs lesions- mich improved Mons pubis area - erythema and induration imrpoving Lymph:none CNS non focal  Lab Results Recent Labs    10/23/21 0612 10/24/21 0317  CREATININE 1.07* 1.02*   Liver Panel No results for input(s): PROT, ALBUMIN, AST, ALT, ALKPHOS, BILITOT, BILIDIR, IBILI in  the last 72 hours. Sedimentation Rate No results for input(s): ESRSEDRATE in the last 72 hours. C-Reactive Protein No results for input(s): CRP in the last 72 hours.  Microbiology: MRSA - wound culture    Assessment/Plan: 45 year old female presenting with bilateral leg swelling and wounds for the past 2 weeks. Bilateral leg lesions pustular with background erythema.  Appears vasculitic but also has MRSA infection on these wounds. Currently on vancomycin and ceftriaxone.  We will discontinue ceftriaxone and change vancomycin to linezolid.  Will need for a total of 10 days.  Vasculitic work-up in progress. ANA positive and dsDNA elevated at 61 Seen by Dr. Gavin Potters  Proteinuria with anasarca.  Creatinine and no hematuria.  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

## 2021-10-24 NOTE — Progress Notes (Signed)
Pharmacy - Antimicrobial Stewardship  Current plan per ID is to send out on linezolid.  Patient uninsured so called medication management.  They have linezolid 600mg  tab with quantity of  #19 in stock.    , PharmD, BCPS, BCIDP Work Cell: [phone] 10/24/2021 4:19 PM

## 2021-10-24 NOTE — Progress Notes (Signed)
[facility]  - [place] at [facility]   PATIENT NAME: Cynthia Hodge    MR#:  [id number]  DATE OF BIRTH:  22-Apr-1976  SUBJECTIVE:  patient came in with significant blisters and painful lesions in both her lower extremity. She attributed started after some exposure to drain all few days ago. She denies using any new detergent or soap or any lotion. Complains of swelling in both lower extremity for last few days. Increasing shortness of breath. Patient overall feels bit better. She is now on room air. Breathing much better. Sats 92 to 97% on room air. REVIEW OF SYSTEMS:   Review of Systems  Constitutional:  Negative for chills, fever and weight loss.  HENT:  Negative for ear discharge, ear pain and nosebleeds.   Eyes:  Negative for blurred vision, pain and discharge.  Respiratory:  Positive for shortness of breath. Negative for sputum production, wheezing and stridor.   Cardiovascular:  Positive for leg swelling. Negative for chest pain, palpitations, orthopnea and PND.  Gastrointestinal:  Negative for abdominal pain, diarrhea, nausea and vomiting.  Genitourinary:  Negative for frequency and urgency.  Musculoskeletal:  Negative for back pain and joint pain.  Skin:  Positive for rash.  Neurological:  Negative for sensory change, speech change, focal weakness and weakness.  Psychiatric/Behavioral:  Negative for depression and hallucinations. The patient is not nervous/anxious.   Tolerating Diet:yes Tolerating PT: self ambulatory  DRUG ALLERGIES:  No Known Allergies  VITALS:  Blood pressure (!) 161/97, pulse 79, temperature 97.7 F (36.5 C), resp. rate 17, height 5\' 5"  (1.651 m), weight 112.8 kg, SpO2 96 %.  PHYSICAL EXAMINATION:   Physical Exam  GENERAL:  45 y.o.-year-old patient lying in the bed with no acute distress. obese HEENT: Head atraumatic, normocephalic. Oropharynx and nasopharynx clear.  LUNGS: Normal breath sounds bilaterally, no wheezing, rales,  rhonchi. No use of accessory muscles of respiration.  CARDIOVASCULAR: S1, S2 normal. No murmurs, rubs, or gallops.  ABDOMEN: Soft, nontender, nondistended. Bowel sounds present. No organomegaly or mass.  EXTREMITIES:    NEUROLOGIC: nonfocal PSYCHIATRIC:  patient is alert and oriented x 3.  SKIN: as above  LABORATORY PANEL:  CBC Recent Labs  Lab 10/21/21 0843  WBC 11.6*  HGB 10.4*  HCT 35.5*  PLT 395     Chemistries  Recent Labs  Lab 10/20/21 1030 10/21/21 0500 10/23/21 0612 10/24/21 0317  NA 135 139  --   --   K 3.6 3.8  --   --   CL 103 100  --   --   CO2 24 26  --   --   GLUCOSE 111* 82  --   --   BUN 16 16  --   --   CREATININE 0.94 0.82   < > 1.02*  CALCIUM 8.7* 8.4*  --   --   MG  --  2.2  --   --   AST 45*  --   --   --   ALT 34  --   --   --   ALKPHOS 80  --   --   --   BILITOT 2.1*  --   --   --    < > = values in this interval not displayed.    Cardiac Enzymes No results for input(s): TROPONINI in the last 168 hours. RADIOLOGY:  No results found. ASSESSMENT AND PLAN:   Cynthia Hodge is a 45 y.o. female without significant past medical history, who presents with  bilateral leg swelling, pain and leg ulcers.  Pt states that she developed ulcers in both legs after she got into a bath that had recently been cleaned with Drano and bleach about 2 weeks ago. She feels like it burned her skin. She also noted bilateral leg edema which has been progressively worsening recently.  Severe sepsis due to cellulitis of left lower legs with multiple ulcers:  ?Vasculitis (HTN,Leg edema, proteinuria, anemia, painful skin lesions) Patient admits critical for sepsis with tachycardia with heart rate of 124, RR 26.  Lactic acid is elevated 2.4.  Patient also has mild leukocytosis.  --sepsis resolved - - IV Rocephin +vancomycin - PRN Zofran for nausea, ibuprofen and oxycodone for pain - Blood cultures x 2 --negative - ID consulted-- Dr. Delaine Lame to see  patient --vasculitis w/u labs ordered -- consider nephrology and rheumatology consultation as well --12/5-- called three dermatology practices in town. None of the MDs come for skin biopsy. I have asked surgery Dr Lysle Pearl to see if they can skin biopsy. -- Case discussed with Dr. Jefm Bryant rheumatology. Recommends skin biopsy. --12/6--Dr Lysle Pearl performed skin biopsy.  --dr Juleen China to see pt  Bilateral leg edema Acute CHF, diastolic--new HTN, uncontrolled --Lower venous Dopplers negative for DVT.   --Patient has elevated BNP of 569, cardiomegaly and interstitial edema chest x-ray, concerning for CHF.   -Patient was given 40 mg of Lasix bid -2D echo EF of 50 to 55%. Moderately elevated right pulmonary arterial pressure. Moderate TR. -- good urine output. Creatinine better.   Mildly abnormal LFTs: May be due to congestion -Avoid using Tylenol   Hypertensive urgency: Denies history of hypertension. Came in with Blood pressure 244/141 -IV hydralazine as needed -on BB -- added low-dose lisinopril --hydralazine tid  Generalized anxiety disorder: consulted Chisholm, Jame Lord. -started gabapentin 100 mg 3 times daily     DVT ppx: SQ Lovenox Code Status: Full code Family Communication: not done, no family member is at bed side.        Disposition Plan:  Anticipate discharge back to previous environment Consults called:  ID, surgery Admission status and Level of care: Progressive:   as inpt             Procedures: Family communication :none Consults :ID CODE STATUS: Full DVT Prophylaxis :Lovenox Level of care: Med-Surg Status is: Inpatient  Remains inpatient appropriate because: CHF, HTN, bilateral lower extremity painful skin lesions        TOTAL TIME TAKING CARE OF THIS PATIENT: 25 minutes.  >50% time spent on counselling and coordination of care  Note: This dictation was prepared with Dragon dictation along with smaller phrase technology. Any transcriptional errors that  result from this process are unintentional.  Fritzi Mandes M.D    [facility]   CC: Primary care physician; Pcp, No Patient ID: Cynthia Hodge, female   DOB: 05/07/1976, 45 y.o.   MRN: [id number]

## 2021-10-25 LAB — URINALYSIS, COMPLETE (UACMP) WITH MICROSCOPIC
Bacteria, UA: NONE SEEN
Bilirubin Urine: NEGATIVE
Glucose, UA: NEGATIVE mg/dL
Hgb urine dipstick: NEGATIVE
Ketones, ur: NEGATIVE mg/dL
Leukocytes,Ua: NEGATIVE
Nitrite: NEGATIVE
Protein, ur: NEGATIVE mg/dL
RBC / HPF: NONE SEEN RBC/hpf (ref 0–5)
Specific Gravity, Urine: 1.02 (ref 1.005–1.030)
pH: 7 (ref 5.0–8.0)

## 2021-10-25 LAB — BASIC METABOLIC PANEL
Anion gap: 6 (ref 5–15)
BUN: 19 mg/dL (ref 6–20)
CO2: 27 mmol/L (ref 22–32)
Calcium: 8.2 mg/dL — ABNORMAL LOW (ref 8.9–10.3)
Chloride: 105 mmol/L (ref 98–111)
Creatinine, Ser: 0.82 mg/dL (ref 0.44–1.00)
GFR, Estimated: >60 mL/min (ref >60)
Glucose, Bld: 97 mg/dL (ref 70–99)
Potassium: 2.6 mmol/L — CL (ref 3.5–5.1)
Sodium: 138 mmol/L (ref 135–145)

## 2021-10-25 LAB — IRON AND TIBC
Iron: 25 ug/dL — ABNORMAL LOW (ref 28–170)
Saturation Ratios: 5 % — ABNORMAL LOW (ref 10.4–31.8)
TIBC: 475 ug/dL — ABNORMAL HIGH (ref 250–450)
UIBC: 450 ug/dL

## 2021-10-25 LAB — CULTURE, BLOOD (ROUTINE X 2)
Culture: NO GROWTH
Culture: NO GROWTH
Special Requests: ADEQUATE

## 2021-10-25 LAB — HEPATITIS B SURFACE ANTIGEN: Hepatitis B Surface Ag: NONREACTIVE

## 2021-10-25 LAB — VITAMIN B12: Vitamin B-12: 314 pg/mL (ref 180–914)

## 2021-10-25 LAB — FOLATE: Folate: 15.9 ng/mL (ref >5.9)

## 2021-10-25 LAB — HEPATITIS C ANTIBODY: HCV Ab: NONREACTIVE

## 2021-10-25 LAB — HEPATITIS B CORE ANTIBODY, TOTAL: Hep B Core Total Ab: NONREACTIVE

## 2021-10-25 LAB — HEPATITIS B CORE ANTIBODY, IGM: Hep B C IgM: NONREACTIVE

## 2021-10-25 LAB — FERRITIN: Ferritin: 33 ng/mL (ref 11–307)

## 2021-10-25 LAB — SEDIMENTATION RATE: Sed Rate: 20 mm/hr (ref 0–20)

## 2021-10-25 LAB — HEPATITIS B SURFACE ANTIBODY,QUALITATIVE: Hep B S Ab: NONREACTIVE

## 2021-10-25 MED ORDER — DIPHENHYDRAMINE HCL 25 MG PO CAPS
25.0000 mg | ORAL_CAPSULE | Freq: Four times a day (QID) | ORAL | Status: DC | PRN
  Administered 2021-10-25: 25 mg via ORAL
  Filled 2021-10-25: qty 1

## 2021-10-25 MED ORDER — METOPROLOL TARTRATE 50 MG PO TABS
50.0000 mg | ORAL_TABLET | Freq: Two times a day (BID) | ORAL | Status: DC
  Administered 2021-10-25 – 2021-10-26 (×2): 50 mg via ORAL
  Filled 2021-10-25 (×2): qty 1

## 2021-10-25 MED ORDER — POTASSIUM CHLORIDE CRYS ER 20 MEQ PO TBCR
40.0000 meq | EXTENDED_RELEASE_TABLET | ORAL | Status: AC
  Administered 2021-10-25 (×2): 40 meq via ORAL
  Filled 2021-10-25 (×2): qty 2

## 2021-10-25 NOTE — Progress Notes (Signed)
[facility]  ROUNDING NOTE   Subjective:   Patient resting comfortably in bed  Alert and oriented Tolerating meals with no complaints of  nausea and vomiting Denies shortness of breath  Objective:  Vital signs in last 24 hours:  Temp:  [97.6 F (36.4 C)-98.7 F (37.1 C)] 97.6 F (36.4 C) (12/07 0845) Pulse Rate:  [79-94] 94 (12/07 0845) Resp:  [15-20] 16 (12/07 0845) BP: (159-186)/(77-97) 163/77 (12/07 0845) SpO2:  [92 %-99 %] 97 % (12/07 0845)  Weight change:  Filed Weights   10/20/21 0917 10/21/21 1353 10/23/21 0344  Weight: 117 kg 114.5 kg 112.8 kg    Intake/Output: I/O last 3 completed shifts: In: 710.6 [P.O.:240; I.V.:170.6; IV Piggyback:300] Out: 4400 [Urine:4400]   Intake/Output this shift:  No intake/output data recorded.  Physical Exam: General: NAD, resting in bed  Head: Normocephalic, atraumatic. Moist oral mucosal membranes, facial macular rash bilateral cheeks  Eyes: Anicteric  Lungs:  Clear to auscultation, normal effort, room air  Heart: Regular rate and rhythm  Abdomen:  Soft, nontender  Extremities:  trace peripheral edema.  Neurologic: Nonfocal, moving all four extremities  Skin: No lesions       Basic Metabolic Panel: Recent Labs  Lab 10/20/21 1030 10/21/21 0500 10/23/21 0612 10/24/21 0317 10/25/21 0530  NA 135 139  --   --  138  K 3.6 3.8  --   --  2.6*  CL 103 100  --   --  105  CO2 24 26  --   --  27  GLUCOSE 111* 82  --   --  97  BUN 16 16  --   --  19  CREATININE 0.94 0.82 1.07* 1.02* 0.82  CALCIUM 8.7* 8.4*  --   --  8.2*  MG  --  2.2  --   --   --     Liver Function Tests: Recent Labs  Lab 10/20/21 1030  AST 45*  ALT 34  ALKPHOS 80  BILITOT 2.1*  PROT 7.4  ALBUMIN 3.8   No results for input(s): LIPASE, AMYLASE in the last 168 hours. No results for input(s): AMMONIA in the last 168 hours.  CBC: Recent Labs  Lab 10/20/21 1030 10/21/21 0843  WBC 11.8* 11.6*  NEUTROABS 9.5*  --   HGB 10.8*  10.4*  HCT 36.8 35.5*  MCV 72.3* 71.9*  PLT 433* 395    Cardiac Enzymes: Recent Labs  Lab 10/21/21 0500  CKTOTAL 310*    BNP: Invalid input(s): POCBNP  CBG: No results for input(s): GLUCAP in the last 168 hours.  Microbiology: Results for orders placed or performed during the hospital encounter of 10/20/21  Resp Panel by RT-PCR (Flu A&B, Covid) Nasopharyngeal Swab     Status: None   Collection Time: 10/20/21 10:30 AM   Specimen: Nasopharyngeal Swab; Nasopharyngeal(NP) swabs in vial transport medium  Result Value Ref Range Status   SARS Coronavirus 2 by RT PCR NEGATIVE NEGATIVE Final    Comment: (NOTE) SARS-CoV-2 target nucleic acids are NOT DETECTED.  The SARS-CoV-2 RNA is generally detectable in upper respiratory specimens during the acute phase of infection. The lowest concentration of SARS-CoV-2 viral copies this assay can detect is 138 copies/mL. A negative result does not preclude SARS-Cov-2 infection and should not be used as the sole basis for treatment or other patient management decisions. A negative result may occur with  improper specimen collection/handling, submission of specimen other than nasopharyngeal swab, presence of viral mutation(s) within the areas targeted  by this assay, and inadequate number of viral copies(<138 copies/mL). A negative result must be combined with clinical observations, patient history, and epidemiological information. The expected result is Negative.  Fact Sheet for Patients:  EntrepreneurPulse.com.au  Fact Sheet for Healthcare Providers:  IncredibleEmployment.be  This test is no t yet approved or cleared by the [place] FDA and  has been authorized for detection and/or diagnosis of SARS-CoV-2 by FDA under an Emergency Use Authorization (EUA). This EUA will remain  in effect (meaning this test can be used) for the duration of the COVID-19 declaration under Section 564(b)(1) of the Act,  21 U.S.C.section 360bbb-3(b)(1), unless the authorization is terminated  or revoked sooner.       Influenza A by PCR NEGATIVE NEGATIVE Final   Influenza B by PCR NEGATIVE NEGATIVE Final    Comment: (NOTE) The Xpert Xpress SARS-CoV-2/FLU/RSV plus assay is intended as an aid in the diagnosis of influenza from Nasopharyngeal swab specimens and should not be used as a sole basis for treatment. Nasal washings and aspirates are unacceptable for Xpert Xpress SARS-CoV-2/FLU/RSV testing.  Fact Sheet for Patients: EntrepreneurPulse.com.au  Fact Sheet for Healthcare Providers: IncredibleEmployment.be  This test is not yet approved or cleared by the [place] FDA and has been authorized for detection and/or diagnosis of SARS-CoV-2 by FDA under an Emergency Use Authorization (EUA). This EUA will remain in effect (meaning this test can be used) for the duration of the COVID-19 declaration under Section 564(b)(1) of the Act, 21 U.S.C. section 360bbb-3(b)(1), unless the authorization is terminated or revoked.  Performed at [facility], [address] [place], [place] [address]   Culture, blood (x 2)     Status: None   Collection Time: 10/20/21  3:31 PM   Specimen: BLOOD  Result Value Ref Range Status   Specimen Description BLOOD LEFT ANTECUBITAL  Final   Special Requests   Final    BOTTLES DRAWN AEROBIC AND ANAEROBIC Blood Culture results may not be optimal due to an inadequate volume of blood received in culture bottles   Culture   Final    NO GROWTH 5 DAYS Performed at [facility], [address], [place], [place] [address]    Report Status 10/25/2021 FINAL  Final  Culture, blood (x 2)     Status: None   Collection Time: 10/20/21  7:46 PM   Specimen: BLOOD  Result Value Ref Range Status   Specimen Description BLOOD RIGHT ANTECUBITAL  Final   Special Requests   Final    BOTTLES DRAWN AEROBIC AND ANAEROBIC Blood Culture  adequate volume   Culture   Final    NO GROWTH 5 DAYS Performed at [facility], [address], [place], [place] [address]    Report Status 10/25/2021 FINAL  Final  MRSA Next Gen by PCR, Nasal     Status: Abnormal   Collection Time: 10/21/21  5:42 PM   Specimen: Nasal Mucosa; Nasal Swab  Result Value Ref Range Status   MRSA by PCR Next Gen DETECTED (A) NOT DETECTED Final    Comment: RESULT CALLED TO, READ BACK BY AND VERIFIED WITH: Idamae Lusher @ 1937 10/21/21 LFD (NOTE) The GeneXpert MRSA Assay (FDA approved for NASAL specimens only), is one component of a comprehensive MRSA colonization surveillance program. It is not intended to diagnose MRSA infection nor to guide or monitor treatment for MRSA infections. Test performance is not FDA approved in patients less than 40 years old. Performed at [facility], [phone]  [address] [place], [place] [address]   Aerobic Culture w Gram Stain (superficial specimen)     Status: None   Collection Time: 10/22/21 11:00 AM   Specimen: Leg  Result Value Ref Range Status   Specimen Description   Final    LEG Performed at [facility], [address], [place], [place] [address]    Special Requests   Final    NONE Performed at [facility], [address] [place], [place] [address]    Gram Stain   Final    NO SQUAMOUS EPITHELIAL CELLS SEEN FEW WBC SEEN FEW GRAM POSITIVE COCCI Performed at [facility], [place] [address] [place], [place] [address]    Culture   Final    MODERATE METHICILLIN RESISTANT STAPHYLOCOCCUS AUREUS   Report Status 10/24/2021 FINAL  Final   Organism ID, Bacteria METHICILLIN RESISTANT STAPHYLOCOCCUS AUREUS  Final      Susceptibility   Methicillin resistant staphylococcus aureus - MIC*    CIPROFLOXACIN >=8 RESISTANT Resistant     ERYTHROMYCIN >=8 RESISTANT Resistant     GENTAMICIN <=0.5 SENSITIVE Sensitive     OXACILLIN >=4 RESISTANT Resistant     TETRACYCLINE <=1 SENSITIVE  Sensitive     VANCOMYCIN 1 SENSITIVE Sensitive     TRIMETH/SULFA <=10 SENSITIVE Sensitive     CLINDAMYCIN <=0.25 SENSITIVE Sensitive     RIFAMPIN <=0.5 SENSITIVE Sensitive     Inducible Clindamycin NEGATIVE Sensitive     * MODERATE METHICILLIN RESISTANT STAPHYLOCOCCUS AUREUS    Coagulation Studies: No results for input(s): LABPROT, INR in the last 72 hours.  Urinalysis: No results for input(s): COLORURINE, LABSPEC, PHURINE, GLUCOSEU, HGBUR, BILIRUBINUR, KETONESUR, PROTEINUR, UROBILINOGEN, NITRITE, LEUKOCYTESUR in the last 72 hours.  Invalid input(s): APPERANCEUR    Imaging: No results found.   Medications:    sodium chloride 10 mL/hr (10/23/21 1624)    bisacodyl  10 mg Rectal Once   Chlorhexidine Gluconate Cloth  6 each Topical Q0600   enoxaparin (LOVENOX) injection  0.5 mg/kg Subcutaneous Q24H   furosemide  40 mg Intravenous BID   gabapentin  100 mg Oral TID   hydrALAZINE  50 mg Oral Q8H   linezolid  600 mg Oral Q12H   lisinopril  10 mg Oral Daily   metoprolol tartrate  25 mg Oral BID   mupirocin ointment  1 application Nasal BID   potassium chloride  40 mEq Oral Q4H   sodium chloride, acetaminophen, guaiFENesin-dextromethorphan, labetalol, ondansetron (ZOFRAN) IV, oxyCODONE, phenol  Assessment/ Plan:  Ms. JALAILA CADENA is a 45 y.o.  female with no significant past medical history, who was admitted to [facility] on 10/20/2021 for Anasarca [R60.1] Bilateral lower leg cellulitis [L03.116, L03.115] Cellulitis of left lower leg [L03.116] Abdominal tenderness [R10.819]   Proteinuria : normal range serum albumin. Normal kidney function. Serologic work up with positive ANA. Normal complements. Negative ANCA. No hematuria - Will obtain UA to assess proteins - Will defer kidney biopsy based on resulted labs and presentation. Many studies pending. Awaiting biopsy results    Anemia: microcytic - Iron studies within acceptable range for this patient     LOS: 5    12/7/202210:55 AM

## 2021-10-25 NOTE — Progress Notes (Signed)
PROGRESS NOTE    Cynthia Hodge  JXB:[id number] DOB: 1976/02/22 DOA: 10/20/2021 PCP: Pcp, No  152A/152A-AA   Assessment & Plan:   Principal Problem:   Cellulitis of left lower leg Active Problems:   Bilateral leg edema   Severe sepsis (HCC)   Abnormal LFTs   Hypertensive urgency   Generalized anxiety disorder   Cynthia Hodge is a 45 y.o. female without significant past medical history, who presents with bilateral leg swelling, pain and leg ulcers.  Pt states that she developed ulcers in both legs after she got into a bath that had recently been cleaned with Drano and bleach about 2 weeks ago. She feels like it burned her skin. She also noted bilateral leg edema which has been progressively worsening recently.   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 --12/6--Dr Tonna Boehringer performed skin biopsy to rule out vasculitis.  ANA positive and dsDNA elevated at 61.  But no other features of lupus at the current time.  Rheum consulted. --switched from IV vanc to Linezolid Plan: --cont Linezolid, will need total of 10-day abx, per ID  Proteinuria with anasarca  -- protein creatinine ratio is only mildly elevated.  Also has normal creatinine and no hematuria so doubt renal involvement with vasculitis. --nephrology consulted   Severe sepsis due to cellulitis  Patient admits critical for sepsis with tachycardia with heart rate of 124, RR 26.  Lactic acid is elevated 2.4.  Patient also has mild leukocytosis.    Acute diastolic CHF exacerbation --presented with swelling in abdomen and BLE --Patient has elevated BNP of 569, cardiomegaly and interstitial edema chest x-ray, concerning for CHF.   -2D echo EF of 50 to 55%. Moderately elevated right pulmonary arterial pressure. Moderate TR. --Pt has been receiving IV lasix 40 mg BID, with improvement in swelling and Cr. Plan: --cont IV lasix 40 mg BID --will transition to oral diuretic prior to discharge   Hypertensive  urgency:  Denies history of hypertension. Came in with Blood pressure 244/141 -IV hydralazine as needed --increase Lopressor to 50 mg BID --cont IV lasix --cont Lisinopril 10 mg daily --d/c hydralazine per pharm rec due to it's association with drug-induced lupus   Generalized anxiety disorder:  consulted [facility], [facility]. --cont gabapentin 100 mg TID (new)   DVT prophylaxis: Lovenox SQ Code Status: Full code  Family Communication:  Level of care: Med-Surg Dispo:   The patient is from: home Anticipated d/c is to: home Anticipated d/c date is: likely tomorrow Patient currently is not medically ready to d/c due to: on IV lasix   Subjective and Interval History:  Pt reported foot itching, so she scratched it, and then it became painful.  Reported swelling in abdomen and legs improved.   Objective: Vitals:   10/25/21 0422 10/25/21 0845 10/25/21 1110 10/25/21 1619  BP: (!) 169/85 (!) 163/77 132/77 134/78  Pulse: 87 94 75 84  Resp: 20 16 16 18   Temp: 97.9 F (36.6 C) 97.6 F (36.4 C) (!) 97.5 F (36.4 C) 97.6 F (36.4 C)  TempSrc: Oral Oral  Oral  SpO2: 92% 97% 98% 97%  Weight:      Height:        Intake/Output Summary (Last 24 hours) at 10/25/2021 2021 Last data filed at 10/25/2021 1833 Gross per 24 hour  Intake 170.64 ml  Output 2100 ml  Net -1929.36 ml   Filed Weights   10/20/21 0917 10/21/21 1353 10/23/21 0344  Weight: 117 kg 114.5 kg 112.8  kg    Examination:   Constitutional: NAD, AAOx3 HEENT: conjunctivae and lids normal, EOMI CV: No cyanosis.   RESP: normal respiratory effort, on RA Extremities: edema in BLE SKIN: warm.  Scabs, ulcers scattered over both lower legs Neuro: II - XII grossly intact.   Psych: Normal mood and affect.  Appropriate judgement and reason   Data Reviewed: I have personally reviewed following labs and imaging studies  CBC: Recent Labs  Lab 10/20/21 1030 10/21/21 0843  WBC 11.8* 11.6*  NEUTROABS 9.5*  --   HGB 10.8*  10.4*  HCT 36.8 35.5*  MCV 72.3* 71.9*  PLT 433* [id number]   Basic Metabolic Panel: Recent Labs  Lab 10/20/21 1030 10/21/21 0500 10/23/21 0612 10/24/21 0317 10/25/21 0530  NA 135 139  --   --  138  K 3.6 3.8  --   --  2.6*  CL 103 100  --   --  105  CO2 24 26  --   --  27  GLUCOSE 111* 82  --   --  97  BUN 16 16  --   --  19  CREATININE 0.94 0.82 1.07* 1.02* 0.82  CALCIUM 8.7* 8.4*  --   --  8.2*  MG  --  2.2  --   --   --    GFR: Estimated Creatinine Clearance: 106.2 mL/min (by C-G formula based on SCr of 0.82 mg/dL). Liver Function Tests: Recent Labs  Lab 10/20/21 1030  AST 45*  ALT 34  ALKPHOS 80  BILITOT 2.1*  PROT 7.4  ALBUMIN 3.8   No results for input(s): LIPASE, AMYLASE in the last 168 hours. No results for input(s): AMMONIA in the last 168 hours. Coagulation Profile: Recent Labs  Lab 10/20/21 1614  INR 1.4*   Cardiac Enzymes: Recent Labs  Lab 10/21/21 0500  CKTOTAL 310*   BNP (last 3 results) No results for input(s): PROBNP in the last 8760 hours. HbA1C: No results for input(s): HGBA1C in the last 72 hours. CBG: No results for input(s): GLUCAP in the last 168 hours. Lipid Profile: No results for input(s): CHOL, HDL, LDLCALC, TRIG, CHOLHDL, LDLDIRECT in the last 72 hours. Thyroid Function Tests: Recent Labs    10/23/21 0612  TSH 1.498   Anemia Panel: Recent Labs    10/25/21 0530  VITAMINB12 314  FOLATE 15.9  FERRITIN 33  TIBC 475*  IRON 25*   Sepsis Labs: Recent Labs  Lab 10/20/21 1030 10/20/21 1530 10/20/21 1614 10/20/21 2125 10/21/21 0843 10/21/21 1525  PROCALCITON <0.10  --   --   --   --   --   LATICACIDVEN 2.4*   < > 2.4* 1.8 2.4* 1.8   < > = values in this interval not displayed.    Recent Results (from the past 240 hour(s))  Resp Panel by RT-PCR (Flu A&B, Covid) Nasopharyngeal Swab     Status: None   Collection Time: 10/20/21 10:30 AM   Specimen: Nasopharyngeal Swab; Nasopharyngeal(NP) swabs in vial transport medium   Result Value Ref Range Status   SARS Coronavirus 2 by RT PCR NEGATIVE NEGATIVE Final    Comment: (NOTE) SARS-CoV-2 target nucleic acids are NOT DETECTED.  The SARS-CoV-2 RNA is generally detectable in upper respiratory specimens during the acute phase of infection. The lowest concentration of SARS-CoV-2 viral copies this assay can detect is 138 copies/mL. A negative result does not preclude SARS-Cov-2 infection and should not be used as the sole basis for treatment or other patient management  decisions. A negative result may occur with  improper specimen collection/handling, submission of specimen other than nasopharyngeal swab, presence of viral mutation(s) within the areas targeted by this assay, and inadequate number of viral copies(<138 copies/mL). A negative result must be combined with clinical observations, patient history, and epidemiological information. The expected result is Negative.  Fact Sheet for Patients:  EntrepreneurPulse.com.au  Fact Sheet for Healthcare Providers:  IncredibleEmployment.be  This test is no t yet approved or cleared by the [place] FDA and  has been authorized for detection and/or diagnosis of SARS-CoV-2 by FDA under an Emergency Use Authorization (EUA). This EUA will remain  in effect (meaning this test can be used) for the duration of the COVID-19 declaration under Section 564(b)(1) of the Act, 21 U.S.C.section 360bbb-3(b)(1), unless the authorization is terminated  or revoked sooner.       Influenza A by PCR NEGATIVE NEGATIVE Final   Influenza B by PCR NEGATIVE NEGATIVE Final    Comment: (NOTE) The Xpert Xpress SARS-CoV-2/FLU/RSV plus assay is intended as an aid in the diagnosis of influenza from Nasopharyngeal swab specimens and should not be used as a sole basis for treatment. Nasal washings and aspirates are unacceptable for Xpert Xpress SARS-CoV-2/FLU/RSV testing.  Fact Sheet for  Patients: EntrepreneurPulse.com.au  Fact Sheet for Healthcare Providers: IncredibleEmployment.be  This test is not yet approved or cleared by the [place] FDA and has been authorized for detection and/or diagnosis of SARS-CoV-2 by FDA under an Emergency Use Authorization (EUA). This EUA will remain in effect (meaning this test can be used) for the duration of the COVID-19 declaration under Section 564(b)(1) of the Act, 21 U.S.C. section 360bbb-3(b)(1), unless the authorization is terminated or revoked.  Performed at [facility], [address] [place], [place] [address]   Culture, blood (x 2)     Status: None   Collection Time: 10/20/21  3:31 PM   Specimen: BLOOD  Result Value Ref Range Status   Specimen Description BLOOD LEFT ANTECUBITAL  Final   Special Requests   Final    BOTTLES DRAWN AEROBIC AND ANAEROBIC Blood Culture results may not be optimal due to an inadequate volume of blood received in culture bottles   Culture   Final    NO GROWTH 5 DAYS Performed at [facility], [address] [place], [place] [address]    Report Status 10/25/2021 FINAL  Final  Culture, blood (x 2)     Status: None   Collection Time: 10/20/21  7:46 PM   Specimen: BLOOD  Result Value Ref Range Status   Specimen Description BLOOD RIGHT ANTECUBITAL  Final   Special Requests   Final    BOTTLES DRAWN AEROBIC AND ANAEROBIC Blood Culture adequate volume   Culture   Final    NO GROWTH 5 DAYS Performed at [facility], [address] [place], [place] [address]    Report Status 10/25/2021 FINAL  Final  MRSA Next Gen by PCR, Nasal     Status: Abnormal   Collection Time: 10/21/21  5:42 PM   Specimen: Nasal Mucosa; Nasal Swab  Result Value Ref Range Status   MRSA by PCR Next Gen DETECTED (A) NOT DETECTED Final    Comment: RESULT CALLED TO, READ BACK BY AND VERIFIED WITH: Idamae Lusher @ 1937 10/21/21 LFD (NOTE) The GeneXpert  MRSA Assay (FDA approved for NASAL specimens only), is one component of a comprehensive MRSA colonization surveillance program. It is not intended to diagnose MRSA infection nor to  guide or monitor treatment for MRSA infections. Test performance is not FDA approved in patients less than 50 years old. Performed at [facility] [address], [place], [place] [address]   Aerobic Culture w Gram Stain (superficial specimen)     Status: None   Collection Time: 10/22/21 11:00 AM   Specimen: Leg  Result Value Ref Range Status   Specimen Description   Final    LEG Performed at [facility], [address], [place], [place] [address]    Special Requests   Final    NONE Performed at [facility], [place], [place] [address]    Gram Stain   Final    NO SQUAMOUS EPITHELIAL CELLS SEEN FEW WBC SEEN FEW GRAM POSITIVE COCCI Performed at [facility], [place] [address] [place], [place] [address]    Culture   Final    MODERATE METHICILLIN RESISTANT STAPHYLOCOCCUS AUREUS   Report Status 10/24/2021 FINAL  Final   Organism ID, Bacteria METHICILLIN RESISTANT STAPHYLOCOCCUS AUREUS  Final      Susceptibility   Methicillin resistant staphylococcus aureus - MIC*    CIPROFLOXACIN >=8 RESISTANT Resistant     ERYTHROMYCIN >=8 RESISTANT Resistant     GENTAMICIN <=0.5 SENSITIVE Sensitive     OXACILLIN >=4 RESISTANT Resistant     TETRACYCLINE <=1 SENSITIVE Sensitive     VANCOMYCIN 1 SENSITIVE Sensitive     TRIMETH/SULFA <=10 SENSITIVE Sensitive     CLINDAMYCIN <=0.25 SENSITIVE Sensitive     RIFAMPIN <=0.5 SENSITIVE Sensitive     Inducible Clindamycin NEGATIVE Sensitive     * MODERATE METHICILLIN RESISTANT STAPHYLOCOCCUS AUREUS      Radiology Studies: No results found.   Scheduled Meds:  bisacodyl  10 mg Rectal Once   Chlorhexidine Gluconate Cloth  6 each Topical Q0600   enoxaparin (LOVENOX) injection  0.5 mg/kg Subcutaneous Q24H   furosemide   40 mg Intravenous BID   gabapentin  100 mg Oral TID   linezolid  600 mg Oral Q12H   lisinopril  10 mg Oral Daily   metoprolol tartrate  50 mg Oral BID   mupirocin ointment  1 application Nasal BID   Continuous Infusions:  sodium chloride 10 mL/hr (10/23/21 1624)     LOS: 5 days     Enzo Bi, MD [facility] If 7PM-7AM, please contact night-coverage 10/25/2021, 8:21 PM

## 2021-10-25 NOTE — Progress Notes (Signed)
ID Pt doing better Leg swelling better No discharge from the leg wounds No fever  O/e awake and alert Patient Vitals for the past 24 hrs:  BP Temp Temp src Pulse Resp SpO2  10/25/21 1619 134/78 97.6 F (36.4 C) Oral 84 18 97 %  10/25/21 1110 132/77 (!) 97.5 F (36.4 C) -- 75 16 98 %  10/25/21 0845 (!) 163/77 97.6 F (36.4 C) Oral 94 16 97 %  10/25/21 0422 (!) 169/85 97.9 F (36.6 C) Oral 87 20 92 %  10/24/21 2052 (!) 159/87 98.7 F (37.1 C) -- 91 20 96 %   Chest CtA Hss1s2 Abd soft Edema and induration lower abdomena nd mons pubis better Erythema and scaling better Legs B/l pustular wounds- superficial and small better Erythema legs better     Cns non focal  Labs CBC Latest Ref Rng & Units 10/21/2021 10/20/2021  WBC 4.0 - 10.5 K/uL 11.6(H) 11.8(H)  Hemoglobin 12.0 - 15.0 g/dL 10.4(L) 10.8(L)  Hematocrit 36.0 - 46.0 % 35.5(L) 36.8  Platelets 150 - 400 K/uL 395 433(H)    CMP Latest Ref Rng & Units 10/25/2021 10/24/2021 10/23/2021  Glucose 70 - 99 mg/dL 97 - -  BUN 6 - 20 mg/dL 19 - -  Creatinine 7.89 - 1.00 mg/dL 3.81 0.17(P) 1.02(H)  Sodium 135 - 145 mmol/L 138 - -  Potassium 3.5 - 5.1 mmol/L 2.6(LL) - -  Chloride 98 - 111 mmol/L 105 - -  CO2 22 - 32 mmol/L 27 - -  Calcium 8.9 - 10.3 mg/dL 8.2(L) - -  Total Protein 6.5 - 8.1 g/dL - - -  Total Bilirubin 0.3 - 1.2 mg/dL - - -  Alkaline Phos 38 - 126 U/L - - -  AST 15 - 41 U/L - - -  ALT 0 - 44 U/L - - -   Micro Wound culture MRSA  Impression/recommendation 45 year old female presenting with bilateral leg swelling and pustular wounds for the past 2 weeks.  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 Skin biopsy has been taken to rule out vasculitis Patient is currently on linezolid switch to started today. She was receiving vancomycin until yesterday.  She will need a total of 10 days of antibiotics  Vasculitic work-up is in progress. ANA positive and dsDNA elevated at 61.  But no other features  of lupus at the current time.  Proteinuria with anasarca but protein creatinine ratio is only mildly elevated.  Also has normal creatinine and no hematuria so doubt renal involvement with vasculitis or any cytokine or such  Malignant hypertension with cardiomegaly. Has diastolic dysfunction Patient is on metoprolol, lisinopril and furosemide.  Ultrasound shows diffusely diffusely increased echogenicity in the liver.  Could be fatty liver.  Discussed the management with the patient and care team.

## 2021-10-25 NOTE — Consult Note (Signed)
Rheumatology follow-up She feels like her legs are better.  Still swollen but no new lesions.  Less pain.  No significant fever Several lesions still have ulcerations.  2-3+ lower extremity edema Low iron saturation Sed rate 21 Biopsy report pending.  Other labs pending.  Creatinine stable  Impressions: Significant diffuse edema, ascites Hypertension Positive ANA and anti-DNA without definite connective tissue disease at this point. Cellulitis with ulceration.  Biopsy pending to rule out vasculitis Anemia, iron deficient Liver dysfunction  Recommendations: Agree with treatment hypertension and edema Awaiting pathology. Would not add immunosuppression at this point.  Happy to reassess antibodies as an outpatient after antibiotics and topical care. Will reassess if biopsy shows vasculitis.

## 2021-10-26 ENCOUNTER — Other Ambulatory Visit: Payer: Self-pay

## 2021-10-26 ENCOUNTER — Telehealth: Payer: Self-pay | Admitting: Pharmacy Technician

## 2021-10-26 LAB — BASIC METABOLIC PANEL
Anion gap: 9 (ref 5–15)
BUN: 19 mg/dL (ref 6–20)
CO2: 27 mmol/L (ref 22–32)
Calcium: 8.4 mg/dL — ABNORMAL LOW (ref 8.9–10.3)
Chloride: 104 mmol/L (ref 98–111)
Creatinine, Ser: 0.96 mg/dL (ref 0.44–1.00)
GFR, Estimated: >60 mL/min (ref >60)
Glucose, Bld: 118 mg/dL — ABNORMAL HIGH (ref 70–99)
Potassium: 3.6 mmol/L (ref 3.5–5.1)
Sodium: 140 mmol/L (ref 135–145)

## 2021-10-26 LAB — PROTEIN ELECTRO, RANDOM URINE
Albumin ELP, Urine: 100 %
Alpha-1-Globulin, U: 0 %
Alpha-2-Globulin, U: 0 %
Beta Globulin, U: 0 %
Gamma Globulin, U: 0 %
Total Protein, Urine: 13.9 mg/dL

## 2021-10-26 LAB — CBC
HCT: 30.9 % — ABNORMAL LOW (ref 36.0–46.0)
Hemoglobin: 9.2 g/dL — ABNORMAL LOW (ref 12.0–15.0)
MCH: 20.9 pg — ABNORMAL LOW (ref 26.0–34.0)
MCHC: 29.8 g/dL — ABNORMAL LOW (ref 30.0–36.0)
MCV: 70.2 fL — ABNORMAL LOW (ref 80.0–100.0)
Platelets: 346 10*3/uL (ref 150–400)
RBC: 4.4 MIL/uL (ref 3.87–5.11)
RDW: 20.6 % — ABNORMAL HIGH (ref 11.5–15.5)
WBC: 7.7 10*3/uL (ref 4.0–10.5)
nRBC: 0 % (ref 0.0–0.2)

## 2021-10-26 LAB — HEMOGLOBIN A1C
Hgb A1c MFr Bld: 6.3 % — ABNORMAL HIGH (ref 4.8–5.6)
Mean Plasma Glucose: 134 mg/dL

## 2021-10-26 LAB — KAPPA/LAMBDA LIGHT CHAINS
Kappa free light chain: 40 mg/L — ABNORMAL HIGH (ref 3.3–19.4)
Kappa, lambda light chain ratio: 1.53 (ref 0.26–1.65)
Lambda free light chains: 26.2 mg/L (ref 5.7–26.3)

## 2021-10-26 LAB — MITOCHONDRIAL ANTIBODIES: Mitochondrial M2 Ab, IgG: <20 Units (ref 0.0–20.0)

## 2021-10-26 LAB — CRYOGLOBULIN

## 2021-10-26 LAB — ANTI-SMOOTH MUSCLE ANTIBODY, IGG: F-Actin IgG: 53 Units — ABNORMAL HIGH (ref 0–19)

## 2021-10-26 LAB — MAGNESIUM: Magnesium: 2.3 mg/dL (ref 1.7–2.4)

## 2021-10-26 MED ORDER — LISINOPRIL 20 MG PO TABS
20.0000 mg | ORAL_TABLET | Freq: Every day | ORAL | Status: DC
  Administered 2021-10-26: 20 mg via ORAL
  Filled 2021-10-26: qty 1

## 2021-10-26 MED ORDER — HYDROCERIN EX CREA: 1.0000 "application " | TOPICAL_CREAM | Freq: Every day | CUTANEOUS | 0 refills | Status: DC

## 2021-10-26 MED ORDER — METOPROLOL TARTRATE 50 MG PO TABS
50.0000 mg | ORAL_TABLET | Freq: Two times a day (BID) | ORAL | 2 refills | Status: DC
  Filled 2021-10-26: qty 60, 30d supply, fill #0
  Filled 2021-11-27 – 2021-12-01 (×3): qty 60, 30d supply, fill #1
  Filled 2021-12-24: qty 60, 30d supply, fill #2

## 2021-10-26 MED ORDER — GABAPENTIN 100 MG PO CAPS
100.0000 mg | ORAL_CAPSULE | Freq: Three times a day (TID) | ORAL | 2 refills | Status: DC
  Filled 2021-10-26: qty 90, 30d supply, fill #0
  Filled 2021-12-01: qty 90, 30d supply, fill #1

## 2021-10-26 MED ORDER — HYDROCERIN EX CREA
TOPICAL_CREAM | Freq: Every day | CUTANEOUS | Status: DC
  Filled 2021-10-26: qty 113

## 2021-10-26 MED ORDER — POTASSIUM CHLORIDE CRYS ER 20 MEQ PO TBCR
40.0000 meq | EXTENDED_RELEASE_TABLET | Freq: Every day | ORAL | 0 refills | Status: DC
  Filled 2021-10-26: qty 14, 7d supply, fill #0

## 2021-10-26 MED ORDER — FUROSEMIDE 40 MG PO TABS
40.0000 mg | ORAL_TABLET | Freq: Every day | ORAL | 0 refills | Status: DC
  Filled 2021-10-26: qty 30, 30d supply, fill #0

## 2021-10-26 MED ORDER — LISINOPRIL 20 MG PO TABS
20.0000 mg | ORAL_TABLET | Freq: Every day | ORAL | 2 refills | Status: DC
  Filled 2021-10-26: qty 30, 30d supply, fill #0
  Filled 2021-12-01 – 2021-12-30 (×2): qty 30, 30d supply, fill #1

## 2021-10-26 MED ORDER — LINEZOLID 600 MG PO TABS
600.0000 mg | ORAL_TABLET | Freq: Two times a day (BID) | ORAL | 0 refills | Status: DC
  Filled 2021-10-26: qty 12, 6d supply, fill #0

## 2021-10-26 MED ORDER — DIPHENHYDRAMINE HCL 25 MG PO CAPS
25.0000 mg | ORAL_CAPSULE | Freq: Three times a day (TID) | ORAL | 0 refills | Status: DC | PRN
  Filled 2021-10-26: qty 10, 4d supply, fill #0

## 2021-10-26 NOTE — Progress Notes (Signed)
[facility]  ROUNDING NOTE   Subjective:   Patient seen resting in bed No complaints at this time  Objective:  Vital signs in last 24 hours:  Temp:  [97.6 F (36.4 C)-98.2 F (36.8 C)] 97.9 F (36.6 C) (12/08 0830) Pulse Rate:  [66-85] 73 (12/08 0830) Resp:  [16-18] 16 (12/08 0830) BP: (122-157)/(71-90) 157/90 (12/08 0830) SpO2:  [96 %-98 %] 96 % (12/08 0830)  Weight change:  Filed Weights   10/20/21 0917 10/21/21 1353 10/23/21 0344  Weight: 117 kg 114.5 kg 112.8 kg    Intake/Output: I/O last 3 completed shifts: In: 170.6 [I.V.:170.6] Out: 2100 [Urine:2100]   Intake/Output this shift:  No intake/output data recorded.  Physical Exam: General: NAD, resting in bed  Head: Normocephalic, atraumatic. Moist oral mucosal membranes, facial macular rash bilateral cheeks  Eyes: Anicteric  Lungs:  Clear to auscultation, normal effort, room air  Heart: Regular rate and rhythm  Abdomen:  Soft, nontender  Extremities:  trace peripheral edema.  Neurologic: Nonfocal, moving all four extremities  Skin: No lesions       Basic Metabolic Panel: Recent Labs  Lab 10/20/21 1030 10/21/21 0500 10/23/21 0612 10/24/21 0317 10/25/21 0530 10/26/21 0345  NA 135 139  --   --  138 140  K 3.6 3.8  --   --  2.6* 3.6  CL 103 100  --   --  105 104  CO2 24 26  --   --  27 27  GLUCOSE 111* 82  --   --  97 118*  BUN 16 16  --   --  19 19  CREATININE 0.94 0.82 1.07* 1.02* 0.82 0.96  CALCIUM 8.7* 8.4*  --   --  8.2* 8.4*  MG  --  2.2  --   --   --  2.3     Liver Function Tests: Recent Labs  Lab 10/20/21 1030  AST 45*  ALT 34  ALKPHOS 80  BILITOT 2.1*  PROT 7.4  ALBUMIN 3.8    No results for input(s): LIPASE, AMYLASE in the last 168 hours. No results for input(s): AMMONIA in the last 168 hours.  CBC: Recent Labs  Lab 10/20/21 1030 10/21/21 0843 10/26/21 0345  WBC 11.8* 11.6* 7.7  NEUTROABS 9.5*  --   --   HGB 10.8* 10.4* 9.2*  HCT 36.8 35.5* 30.9*  MCV  72.3* 71.9* 70.2*  PLT 433* 395 346     Cardiac Enzymes: Recent Labs  Lab 10/21/21 0500  CKTOTAL 310*     BNP: Invalid input(s): POCBNP  CBG: No results for input(s): GLUCAP in the last 168 hours.  Microbiology: Results for orders placed or performed during the hospital encounter of 10/20/21  Resp Panel by RT-PCR (Flu A&B, Covid) Nasopharyngeal Swab     Status: None   Collection Time: 10/20/21 10:30 AM   Specimen: Nasopharyngeal Swab; Nasopharyngeal(NP) swabs in vial transport medium  Result Value Ref Range Status   SARS Coronavirus 2 by RT PCR NEGATIVE NEGATIVE Final    Comment: (NOTE) SARS-CoV-2 target nucleic acids are NOT DETECTED.  The SARS-CoV-2 RNA is generally detectable in upper respiratory specimens during the acute phase of infection. The lowest concentration of SARS-CoV-2 viral copies this assay can detect is 138 copies/mL. A negative result does not preclude SARS-Cov-2 infection and should not be used as the sole basis for treatment or other patient management decisions. A negative result may occur with  improper specimen collection/handling, submission of specimen other than nasopharyngeal  swab, presence of viral mutation(s) within the areas targeted by this assay, and inadequate number of viral copies(<138 copies/mL). A negative result must be combined with clinical observations, patient history, and epidemiological information. The expected result is Negative.  Fact Sheet for Patients:  BloggerCourse.comhttps://www.fda.gov/media/152166/download  Fact Sheet for Healthcare Providers:  SeriousBroker.ithttps://www.fda.gov/media/152162/download  This test is no t yet approved or cleared by the [place] FDA and  has been authorized for detection and/or diagnosis of SARS-CoV-2 by FDA under an Emergency Use Authorization (EUA). This EUA will remain  in effect (meaning this test can be used) for the duration of the COVID-19 declaration under Section 564(b)(1) of the Act,  21 U.S.C.section 360bbb-3(b)(1), unless the authorization is terminated  or revoked sooner.       Influenza A by PCR NEGATIVE NEGATIVE Final   Influenza B by PCR NEGATIVE NEGATIVE Final    Comment: (NOTE) The Xpert Xpress SARS-CoV-2/FLU/RSV plus assay is intended as an aid in the diagnosis of influenza from Nasopharyngeal swab specimens and should not be used as a sole basis for treatment. Nasal washings and aspirates are unacceptable for Xpert Xpress SARS-CoV-2/FLU/RSV testing.  Fact Sheet for Patients: BloggerCourse.comhttps://www.fda.gov/media/152166/download  Fact Sheet for Healthcare Providers: SeriousBroker.ithttps://www.fda.gov/media/152162/download  This test is not yet approved or cleared by the [place] FDA and has been authorized for detection and/or diagnosis of SARS-CoV-2 by FDA under an Emergency Use Authorization (EUA). This EUA will remain in effect (meaning this test can be used) for the duration of the COVID-19 declaration under Section 564(b)(1) of the Act, 21 U.S.C. section 360bbb-3(b)(1), unless the authorization is terminated or revoked.  Performed at [facility], [address] [place], [place] [address]   Culture, blood (x 2)     Status: None   Collection Time: 10/20/21  3:31 PM   Specimen: BLOOD  Result Value Ref Range Status   Specimen Description BLOOD LEFT ANTECUBITAL  Final   Special Requests   Final    BOTTLES DRAWN AEROBIC AND ANAEROBIC Blood Culture results may not be optimal due to an inadequate volume of blood received in culture bottles   Culture   Final    NO GROWTH 5 DAYS Performed at [facility], [address] [place], [place] [address]    Report Status 10/25/2021 FINAL  Final  Culture, blood (x 2)     Status: None   Collection Time: 10/20/21  7:46 PM   Specimen: BLOOD  Result Value Ref Range Status   Specimen Description BLOOD RIGHT ANTECUBITAL  Final   Special Requests   Final    BOTTLES DRAWN AEROBIC AND ANAEROBIC Blood Culture  adequate volume   Culture   Final    NO GROWTH 5 DAYS Performed at [facility], [address] [place], [place] [address]    Report Status 10/25/2021 FINAL  Final  MRSA Next Gen by PCR, Nasal     Status: Abnormal   Collection Time: 10/21/21  5:42 PM   Specimen: Nasal Mucosa; Nasal Swab  Result Value Ref Range Status   MRSA by PCR Next Gen DETECTED (A) NOT DETECTED Final    Comment: RESULT CALLED TO, READ BACK BY AND VERIFIED WITH: Leanora IvanoffLINDSEY MARTIN @ 1937 10/21/21 LFD (NOTE) The GeneXpert MRSA Assay (FDA approved for NASAL specimens only), is one component of a comprehensive MRSA colonization surveillance program. It is not intended to diagnose MRSA infection nor to guide or monitor treatment for MRSA infections. Test performance is not FDA approved in patients less than  89 years old. Performed at [facility], [address] [place], [place] [address]   Aerobic Culture w Gram Stain (superficial specimen)     Status: None   Collection Time: 10/22/21 11:00 AM   Specimen: Leg  Result Value Ref Range Status   Specimen Description   Final    LEG Performed at [facility], [address] [place], [place] [address]    Special Requests   Final    NONE Performed at [facility], [address] [place], [place] [address]    Gram Stain   Final    NO SQUAMOUS EPITHELIAL CELLS SEEN FEW WBC SEEN FEW GRAM POSITIVE COCCI Performed at [facility], [address], [place], [place] [address]    Culture   Final    MODERATE METHICILLIN RESISTANT STAPHYLOCOCCUS AUREUS   Report Status 10/24/2021 FINAL  Final   Organism ID, Bacteria METHICILLIN RESISTANT STAPHYLOCOCCUS AUREUS  Final      Susceptibility   Methicillin resistant staphylococcus aureus - MIC*    CIPROFLOXACIN >=8 RESISTANT Resistant     ERYTHROMYCIN >=8 RESISTANT Resistant     GENTAMICIN <=0.5 SENSITIVE Sensitive     OXACILLIN >=4 RESISTANT Resistant     TETRACYCLINE <=1 SENSITIVE  Sensitive     VANCOMYCIN 1 SENSITIVE Sensitive     TRIMETH/SULFA <=10 SENSITIVE Sensitive     CLINDAMYCIN <=0.25 SENSITIVE Sensitive     RIFAMPIN <=0.5 SENSITIVE Sensitive     Inducible Clindamycin NEGATIVE Sensitive     * MODERATE METHICILLIN RESISTANT STAPHYLOCOCCUS AUREUS    Coagulation Studies: No results for input(s): LABPROT, INR in the last 72 hours.  Urinalysis: Recent Labs    10/25/21 1200  COLORURINE YELLOW  LABSPEC 1.020  PHURINE 7.0  GLUCOSEU NEGATIVE  HGBUR NEGATIVE  BILIRUBINUR NEGATIVE  KETONESUR NEGATIVE  PROTEINUR NEGATIVE  NITRITE NEGATIVE  LEUKOCYTESUR NEGATIVE       Imaging: No results found.   Medications:    sodium chloride 10 mL/hr (10/23/21 1624)    bisacodyl  10 mg Rectal Once   enoxaparin (LOVENOX) injection  0.5 mg/kg Subcutaneous Q24H   furosemide  40 mg Intravenous BID   gabapentin  100 mg Oral TID   hydrocerin   Topical Daily   linezolid  600 mg Oral Q12H   lisinopril  20 mg Oral Daily   metoprolol tartrate  50 mg Oral BID   sodium chloride, acetaminophen, diphenhydrAMINE, guaiFENesin-dextromethorphan, labetalol, ondansetron (ZOFRAN) IV, oxyCODONE, phenol  Assessment/ Plan:  [name] is a 45 y.o.  female with no significant past medical history, who was admitted to [facility] on 10/20/2021 for Anasarca [R60.1] Bilateral lower leg cellulitis [L03.116, L03.115] Cellulitis of left lower leg [L03.116] Abdominal tenderness [R10.819]   Proteinuria : normal range serum albumin. Normal kidney function. Serologic work up with positive ANA. Normal complements. Negative ANCA. No hematuria - Recent UA negative for protein - Many studies pending. Awaiting biopsy results    Anemia: microcytic - Iron studies within acceptable range for this patient   We will sign off case at this time. Please feel free to contact us with any concerns.   LOS: 6   12/8/202211:49 AM

## 2021-10-26 NOTE — Telephone Encounter (Signed)
Patient received a 30 day supply of medication.  Provided patient with new patient packet to obtain ongoing [facility] services.  [facility] must receive requested financial documentation within 30 days in order to determine eligibility and provide additional medication assistance.  Annaleia Pence J. Everet Flagg Care Manager [facility] 

## 2021-10-26 NOTE — TOC Progression Note (Signed)
Transition of Care [facility]) - Progression Note    Patient Details  Name: Cynthia Hodge MRN: [id number] Date of Birth: [birth year]  Transition of Care [facility]) CM/SW Contact  Marlowe Sax, RN Phone Number: 10/26/2021, 11:59 AM  Clinical Narrative:   Patient has open door clinic information and will call for appointment, NO additional TOC needs         Expected Discharge Plan and Services           Expected Discharge Date: 10/26/21                                     Social Determinants of Health (SDOH) Interventions    Readmission Risk Interventions No flowsheet data found.

## 2021-10-26 NOTE — Discharge Summary (Signed)
Physician Discharge Summary   [name]  female DOB: 07-11-1976  NO:[id number]  PCP: Pcp, No  Admit date: 10/20/2021 Discharge date: 10/26/2021  Admitted From: home Disposition:  home CODE STATUS: Full code  Discharge Instructions     Discharge instructions   Complete by: As directed    You have been treated for MRSA infection of your skin.  Please finish taking 6 more days of Linezolid after discharge.  You have been prescribed Lasix 40 mg daily for 30 days to remove extra fluid buildup.  Please follow up with outpatient provider to see if you need further refill.  You are also prescribed potassium supplement for 7 days.  Your blood pressure was high on presentation.  You have been started on new blood pressure medications, Lisinopril and Lopressor.  Please follow up with outpatient provider for further adjustment.   Dr. Enzo Bi - -   Discharge wound care:   Complete by: As directed    Wash each lower leg with one CHG wipe. Place Aquacel Advantage Kellie Simmering [phone]) over the weeping and wound areas (will likely take at least 3 per leg). Then cover with ABD pads (at least 2 per anterior leg). Beginning behind the toes and going to just below the knees, spiral wrap kerlix. Change daily and when soiled/wet with secretions. [facility] Course:  For full details, please see H&P, progress notes, consult notes and ancillary notes.  Briefly,  Cynthia Hodge is a 45 y.o. female without significant past medical history, who presents with bilateral leg swelling, pain and leg ulcers.  Pt states that she developed ulcers in both legs after she got into a bath that had recently been cleaned with Drano and bleach about 2 weeks ago. She feels like it burned her skin. She also noted bilateral leg edema which has been progressively worsening recently.   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 --12/6--Dr Lysle Pearl performed skin biopsy to rule out  vasculitis.  ANA positive and dsDNA elevated at 61.  But no other features of lupus at the current time.  Rheum consulted, found no strong evidence of vasculitis at this time. --ID consulted.  Pt was started on IV vanc and switched to Linezolid.   --Pt is discharged on 6 more days of Linezolid to finish a total 10-day course, per ID rec. --wound care instructions given.  Proteinuria, resolved -- protein creatinine ratio is only mildly elevated.  Also has normal creatinine and no hematuria so doubt renal involvement with vasculitis. --repeat UA neg for protein.   Severe sepsis due to cellulitis  Patient admits critical for sepsis with tachycardia with heart rate of 124, RR 26.  Lactic acid is elevated 2.4.  Patient also has mild leukocytosis.   Acute diastolic CHF exacerbation --presented with swelling in abdomen and BLE --Patient has elevated BNP of 569, cardiomegaly and interstitial edema chest x-ray, concerning for CHF.   -2D echo EF of 50 to 55%. Moderately elevated right pulmonary arterial pressure. Moderate TR. --Pt has been receiving IV lasix 40 mg BID, with improvement in swelling and Cr. --Pt was prescribed Lasix 40 mg daily for 30 days at discharge and potassium supplement for 7 days.  Pt was advised to follow up with outpatient provider to see if she has continued need for diuretic.   Hypertensive urgency:  Denies history of hypertension. Came in with Blood pressure 244/141 --pt was started on new blood pressure medications,  Lisinopril and Lopressor and advised to follow up with outpatient provider for further adjustment.   Generalized anxiety disorder:  consulted [facility], Jame Lord, who started pt on gabapentin 100 mg TID.    Discharge Diagnoses:  Principal Problem:   Cellulitis of left lower leg Active Problems:   Bilateral leg edema   Severe sepsis (HCC)   Abnormal LFTs   Hypertensive urgency   Generalized anxiety disorder   30 Day Unplanned Readmission Risk Score     Flowsheet Row ED to Hosp-Admission (Current) from 10/20/2021 in [facility] (1A)  30 Day Unplanned Readmission Risk Score (%) 10.92 Filed at 10/26/2021 0801       This score is the patient's risk of an unplanned readmission within 30 days of being discharged (0 -100%). The score is based on dignosis, age, lab data, medications, orders, and past utilization.   Low:  0-14.9   Medium: 15-21.9   High: 22-29.9   Extreme: 30 and above         Discharge Instructions:  Allergies as of 10/26/2021   No Known Allergies      Medication List     TAKE these medications    diphenhydrAMINE 25 mg capsule Commonly known as: BENADRYL Take 1 capsule (25 mg total) by mouth every 8 (eight) hours as needed for itching.   furosemide 40 MG tablet Commonly known as: Lasix Take 1 tablet (40 mg total) by mouth once daily.   gabapentin 100 MG capsule Commonly known as: NEURONTIN Take 1 capsule (100 mg total) by mouth 3 (three) times daily for generalized anxiety disorder.   hydrocerin Crea Apply 1 application topically daily.   linezolid 600 MG tablet Commonly known as: ZYVOX Take 1 tablet (600 mg total) by mouth once every 12 (twelve) hours for 6 days.   lisinopril 20 MG tablet Commonly known as: ZESTRIL Take 1 tablet (20 mg total) by mouth once daily.   metoprolol tartrate 50 MG tablet Commonly known as: LOPRESSOR Take 1 tablet (50 mg total) by mouth 2 (two) times daily.   potassium chloride SA 20 MEQ tablet Commonly known as: KLOR-CON M Take 2 tablets (40 mEq total) by mouth once daily for 7 days.               Discharge Care Instructions  (From admission, onward)           Start     Ordered   10/26/21 0000  Discharge wound care:       Comments: Wash each lower leg with one CHG wipe. Place Aquacel Advantage [place] [phone]) over the weeping and wound areas (will likely take at least 3 per leg). Then cover with ABD pads (at least 2 per  anterior leg). Beginning behind the toes and going to just below the knees, spiral wrap kerlix. Change daily and when soiled/wet with secretions. - -   10/26/21 5374             Follow-up Information     Tonna Boehringer, Isami, DO Follow up in 1 week(s).   Specialty: Surgery Why: suture removal from skin biopsy Contact information: [address] [place] [address] [phone]         Kandyce Rud., MD Follow up.   Specialty: Rheumatology Why: As needed, If symptoms worsen Contact information: [address] [place] [address] [phone]                 No Known Allergies  The results of significant diagnostics from this hospitalization (including imaging, microbiology, ancillary and laboratory) are listed below for reference.   Consultations:   Procedures/Studies: DG Chest 2 View  Result Date: 10/20/2021 CLINICAL DATA:  45 year old female with progressive lower extremity swelling for 1 month. Suspected new onset CHF. EXAM: CHEST - 2 VIEW COMPARISON:  None. FINDINGS: Mild to moderate cardiomegaly. Other mediastinal contours are within normal limits. Visualized tracheal air column is within normal limits. Mildly elevated right hemidiaphragm but superimposed veiling right lung base opacity compatible with small to moderate right pleural effusion. Diffuse pulmonary vascular congestion. Streaky probable right lung base atelectasis. No pneumothorax. No left effusion. No osseous abnormality identified.  Negative visible bowel gas. IMPRESSION: Mild or moderate cardiomegaly with pulmonary interstitial edema and right pleural effusion. Electronically Signed   By: Genevie Ann M.D.   On: 10/20/2021 09:35   US Abdomen Complete  Result Date: 10/21/2021 CLINICAL DATA:  Abdominal tenderness for 1 day. EXAM: ABDOMEN ULTRASOUND COMPLETE COMPARISON:  None. FINDINGS: Gallbladder: No gallbladder wall thickening visualized. Question 8.5 mm calculus in the dependent  portion of the gallbladder. No sonographic Murphy sign noted by sonographer. Common bile duct: Diameter: 4.2 mm Liver: No focal lesion identified. Diffusely increased parenchymal echogenicity. Portal vein is patent on color Doppler imaging with normal direction of blood flow towards the liver. IVC: No abnormality visualized. Pancreas: Obscured by gas. Spleen: Size and appearance within normal limits. Right Kidney: Length: 12.1 cm. Echogenicity within normal limits. No mass or hydronephrosis visualized. Left Kidney: Length: 11.3 cm. Echogenicity within normal limits. No mass or hydronephrosis visualized. Abdominal aorta: No aneurysm visualized. Other findings: Small to moderate ascites, most noticeable in perihepatic location. IMPRESSION: Small to moderate abdominopelvic ascites. Questionable cholelithiasis without sonographic evidence of cholecystitis. Diffusely increased echogenicity of the liver usually associated with intrinsic liver disease. Electronically Signed   By: Fidela Salisbury M.D.   On: 10/21/2021 13:16   US Venous Img Lower Bilateral  Result Date: 10/20/2021 CLINICAL DATA:  45 year old female with bilateral leg swelling, ulcerations, erythema. EXAM: BILATERAL LOWER EXTREMITY VENOUS DOPPLER ULTRASOUND TECHNIQUE: Gray-scale sonography with graded compression, as well as color Doppler and duplex ultrasound were performed to evaluate the lower extremity deep venous systems from the level of the common femoral vein and including the common femoral, femoral, profunda femoral, popliteal and calf veins including the posterior tibial, peroneal and gastrocnemius veins when visible. The superficial great saphenous vein was also interrogated. Spectral Doppler was utilized to evaluate flow at rest and with distal augmentation maneuvers in the common femoral, femoral and popliteal veins. COMPARISON:  None. FINDINGS: RIGHT LOWER EXTREMITY Common Femoral Vein: No evidence of thrombus. Normal compressibility,  respiratory phasicity and response to augmentation. Saphenofemoral Junction: No evidence of thrombus. Normal compressibility and flow on color Doppler imaging. Profunda Femoral Vein: No evidence of thrombus. Normal compressibility and flow on color Doppler imaging. Femoral Vein: No evidence of thrombus. Normal compressibility, respiratory phasicity and response to augmentation. Popliteal Vein: No evidence of thrombus. Normal compressibility, respiratory phasicity and response to augmentation. Calf Veins: No evidence of thrombus. Normal compressibility and flow on color Doppler imaging. Other Findings:  None. LEFT LOWER EXTREMITY Common Femoral Vein: No evidence of thrombus. Normal compressibility, respiratory phasicity and response to augmentation. Saphenofemoral Junction: No evidence of thrombus. Normal compressibility and flow on color Doppler imaging. Profunda Femoral Vein: No evidence of thrombus. Normal compressibility and flow on color Doppler imaging. Femoral Vein: No evidence of thrombus. Normal compressibility, respiratory phasicity and response to augmentation.  Popliteal Vein: No evidence of thrombus. Normal compressibility, respiratory phasicity and response to augmentation. Calf Veins: No evidence of thrombus. Normal compressibility and flow on color Doppler imaging. Other Findings:  None. IMPRESSION: No evidence of bilateral lower extremity deep venous thrombosis. Marliss Coots, MD [facility] Electronically Signed   By: Marliss Coots M.D.   On: 10/20/2021 10:22   ECHOCARDIOGRAM COMPLETE  Result Date: 10/22/2021    ECHOCARDIOGRAM REPORT   Patient Name:   Cynthia Hodge Date of Exam: 10/21/2021 Medical Rec #:  [id number]        Height:       65.0 in Accession #:    [id number]       Weight:       258.0 lb Date of Birth:  27-Jun-1976        BSA:          2.204 m Patient Age:    45 years         BP:           127/64 mmHg Patient Gender: F                 HR:           98 bpm. Exam Location:  [facility] Procedure: 2D Echo Indications:     Diastolic CHF  History:         Patient has no prior history of Echocardiogram examinations.                  Risk Factors:Morbid Obesity.  Sonographer:     L Thornton-Maynard Referring Phys:  [id number] Brien Few NIU Diagnosing Phys: Alwyn Pea MD IMPRESSIONS  1. Left ventricular ejection fraction, by estimation, is 50 to 55%. The left ventricle has low normal function. The left ventricle has no regional wall motion abnormalities. There is mild left ventricular hypertrophy. Left ventricular diastolic parameters are consistent with Grade II diastolic dysfunction (pseudonormalization).  2. Right ventricular systolic function is normal. The right ventricular size is normal. There is moderately elevated pulmonary artery systolic pressure.  3. The mitral valve is normal in structure. Mild mitral valve regurgitation.  4. Tricuspid valve regurgitation is moderate.  5. The aortic valve is grossly normal. Aortic valve regurgitation is trivial. FINDINGS  Left Ventricle: Left ventricular ejection fraction, by estimation, is 50 to 55%. The left ventricle has low normal function. The left ventricle has no regional wall motion abnormalities. The left ventricular internal cavity size was normal in size. There is mild left ventricular hypertrophy. Left ventricular diastolic parameters are consistent with Grade II diastolic dysfunction (pseudonormalization). Right Ventricle: The right ventricular size is normal. No increase in right ventricular wall thickness. Right ventricular systolic function is normal. There is moderately elevated pulmonary artery systolic pressure. The tricuspid regurgitant velocity is 3.62 m/s, and with an assumed right atrial pressure of 3 mmHg, the estimated right ventricular systolic pressure is 55.4 mmHg. Left Atrium: Left atrial size was normal in size. Right Atrium: Right atrial size was normal in size. Pericardium: There  is no evidence of pericardial effusion. Mitral Valve: The mitral valve is normal in structure. Mild mitral valve regurgitation. Tricuspid Valve: The tricuspid valve is normal in structure. Tricuspid valve regurgitation is moderate. Aortic Valve: The aortic valve is grossly normal. Aortic valve regurgitation is trivial. Aortic valve mean gradient measures 6.0 mmHg. Aortic valve peak gradient measures 11.2 mmHg. Aortic valve area, by VTI measures 1.56 cm. Pulmonic Valve: The pulmonic valve was  normal in structure. Pulmonic valve regurgitation is not visualized. Aorta: The ascending aorta was not well visualized. IAS/Shunts: The atrial septum is grossly normal. Additional Comments: There is no pleural effusion.  LEFT VENTRICLE PLAX 2D LVIDd:         4.49 cm     Diastology LVIDs:         3.35 cm     LV e' medial:    6.85 cm/s LV PW:         1.18 cm     LV E/e' medial:  16.6 LV IVS:        1.43 cm     LV e' lateral:   5.66 cm/s LVOT diam:     1.70 cm     LV E/e' lateral: 20.1 LV SV:         44 LV SV Index:   20 LVOT Area:     2.27 cm  LV Volumes (MOD) LV vol d, MOD A2C: 54.4 ml LV vol d, MOD A4C: 70.4 ml LV vol s, MOD A2C: 29.5 ml LV vol s, MOD A4C: 30.5 ml LV SV MOD A2C:     24.9 ml LV SV MOD A4C:     70.4 ml LV SV MOD BP:      32.5 ml RIGHT VENTRICLE RV S prime:     10.30 cm/s TAPSE (M-mode): 1.5 cm LEFT ATRIUM             Index        RIGHT ATRIUM           Index LA diam:        4.00 cm 1.81 cm/m   RA Area:     22.80 cm LA Vol (A2C):   77.5 ml 35.16 ml/m  RA Volume:   69.00 ml  31.30 ml/m LA Vol (A4C):   69.7 ml 31.62 ml/m LA Biplane Vol: 75.7 ml 34.34 ml/m  AORTIC VALVE                     PULMONIC VALVE AV Area (Vmax):    1.52 cm      PV Vmax:       0.86 m/s AV Area (Vmean):   1.40 cm      PV Peak grad:  2.9 mmHg AV Area (VTI):     1.56 cm AV Vmax:           167.00 cm/s AV Vmean:          117.000 cm/s AV VTI:            0.284 m AV Peak Grad:      11.2 mmHg AV Mean Grad:      6.0 mmHg LVOT Vmax:          112.00 cm/s LVOT Vmean:        72.200 cm/s LVOT VTI:          0.195 m LVOT/AV VTI ratio: 0.69  AORTA Ao Root diam: 3.10 cm Ao Asc diam:  3.10 cm MITRAL VALVE                TRICUSPID VALVE MV Area (PHT): 5.84 cm     TR Peak grad:   52.4 mmHg MV Decel Time: 130 msec     TR Vmax:        362.00 cm/s MV E velocity: 114.00 cm/s MV A velocity: 51.40 cm/s   SHUNTS MV E/A ratio:  2.22  Systemic VTI:  0.20 m                             Systemic Diam: 1.70 cm Yolonda Kida MD Electronically signed by Yolonda Kida MD Signature Date/Time: 10/22/2021/9:37:11 AM    Final       Labs: BNP (last 3 results) Recent Labs    10/20/21 1030  BNP [id number]   Basic Metabolic Panel: Recent Labs  Lab 10/20/21 1030 10/21/21 0500 10/23/21 0612 10/24/21 0317 10/25/21 0530 10/26/21 0345  NA 135 139  --   --  138 140  K 3.6 3.8  --   --  2.6* 3.6  CL 103 100  --   --  105 104  CO2 24 26  --   --  27 27  GLUCOSE 111* 82  --   --  97 118*  BUN 16 16  --   --  19 19  CREATININE 0.94 0.82 1.07* 1.02* 0.82 0.96  CALCIUM 8.7* 8.4*  --   --  8.2* 8.4*  MG  --  2.2  --   --   --  2.3   Liver Function Tests: Recent Labs  Lab 10/20/21 1030  AST 45*  ALT 34  ALKPHOS 80  BILITOT 2.1*  PROT 7.4  ALBUMIN 3.8   No results for input(s): LIPASE, AMYLASE in the last 168 hours. No results for input(s): AMMONIA in the last 168 hours. CBC: Recent Labs  Lab 10/20/21 1030 10/21/21 0843 10/26/21 0345  WBC 11.8* 11.6* 7.7  NEUTROABS 9.5*  --   --   HGB 10.8* 10.4* 9.2*  HCT 36.8 35.5* 30.9*  MCV 72.3* 71.9* 70.2*  PLT 433* 395 346   Cardiac Enzymes: Recent Labs  Lab 10/21/21 0500  CKTOTAL 310*   BNP: Invalid input(s): POCBNP CBG: No results for input(s): GLUCAP in the last 168 hours. D-Dimer No results for input(s): DDIMER in the last 72 hours. Hgb A1c Recent Labs    10/25/21 0530  HGBA1C 6.3*   Lipid Profile No results for input(s): CHOL, HDL, LDLCALC, TRIG, CHOLHDL, LDLDIRECT in the  last 72 hours. Thyroid function studies No results for input(s): TSH, T4TOTAL, T3FREE, THYROIDAB in the last 72 hours.  Invalid input(s): FREET3 Anemia work up Recent Labs    10/25/21 0530  VITAMINB12 314  FOLATE 15.9  FERRITIN 33  TIBC 475*  IRON 25*   Urinalysis    Component Value Date/Time   COLORURINE YELLOW 10/25/2021 1200   APPEARANCEUR CLEAR 10/25/2021 1200   LABSPEC 1.020 10/25/2021 1200   PHURINE 7.0 10/25/2021 1200   GLUCOSEU NEGATIVE 10/25/2021 1200   HGBUR NEGATIVE 10/25/2021 1200   BILIRUBINUR NEGATIVE 10/25/2021 1200   KETONESUR NEGATIVE 10/25/2021 1200   PROTEINUR NEGATIVE 10/25/2021 1200   NITRITE NEGATIVE 10/25/2021 1200   LEUKOCYTESUR NEGATIVE 10/25/2021 1200   Sepsis Labs Invalid input(s): PROCALCITONIN,  WBC,  LACTICIDVEN Microbiology Recent Results (from the past 240 hour(s))  Resp Panel by RT-PCR (Flu A&B, Covid) Nasopharyngeal Swab     Status: None   Collection Time: 10/20/21 10:30 AM   Specimen: Nasopharyngeal Swab; Nasopharyngeal(NP) swabs in vial transport medium  Result Value Ref Range Status   SARS Coronavirus 2 by RT PCR NEGATIVE NEGATIVE Final    Comment: (NOTE) SARS-CoV-2 target nucleic acids are NOT DETECTED.  The SARS-CoV-2 RNA is generally detectable in upper respiratory specimens during the acute phase of infection. The lowest concentration of SARS-CoV-2  viral copies this assay can detect is 138 copies/mL. A negative result does not preclude SARS-Cov-2 infection and should not be used as the sole basis for treatment or other patient management decisions. A negative result may occur with  improper specimen collection/handling, submission of specimen other than nasopharyngeal swab, presence of viral mutation(s) within the areas targeted by this assay, and inadequate number of viral copies(<138 copies/mL). A negative result must be combined with clinical observations, patient history, and epidemiological information. The expected  result is Negative.  Fact Sheet for Patients:  EntrepreneurPulse.com.au  Fact Sheet for Healthcare Providers:  IncredibleEmployment.be  This test is no t yet approved or cleared by the [place] FDA and  has been authorized for detection and/or diagnosis of SARS-CoV-2 by FDA under an Emergency Use Authorization (EUA). This EUA will remain  in effect (meaning this test can be used) for the duration of the COVID-19 declaration under Section 564(b)(1) of the Act, 21 U.S.C.section 360bbb-3(b)(1), unless the authorization is terminated  or revoked sooner.       Influenza A by PCR NEGATIVE NEGATIVE Final   Influenza B by PCR NEGATIVE NEGATIVE Final    Comment: (NOTE) The Xpert Xpress SARS-CoV-2/FLU/RSV plus assay is intended as an aid in the diagnosis of influenza from Nasopharyngeal swab specimens and should not be used as a sole basis for treatment. Nasal washings and aspirates are unacceptable for Xpert Xpress SARS-CoV-2/FLU/RSV testing.  Fact Sheet for Patients: EntrepreneurPulse.com.au  Fact Sheet for Healthcare Providers: IncredibleEmployment.be  This test is not yet approved or cleared by the [place] FDA and has been authorized for detection and/or diagnosis of SARS-CoV-2 by FDA under an Emergency Use Authorization (EUA). This EUA will remain in effect (meaning this test can be used) for the duration of the COVID-19 declaration under Section 564(b)(1) of the Act, 21 U.S.C. section 360bbb-3(b)(1), unless the authorization is terminated or revoked.  Performed at [facility], [address] [place], [place] [address]   Culture, blood (x 2)     Status: None   Collection Time: 10/20/21  3:31 PM   Specimen: BLOOD  Result Value Ref Range Status   Specimen Description BLOOD LEFT ANTECUBITAL  Final   Special Requests   Final    BOTTLES DRAWN AEROBIC AND ANAEROBIC Blood Culture results  may not be optimal due to an inadequate volume of blood received in culture bottles   Culture   Final    NO GROWTH 5 DAYS Performed at [facility], [address], [place], [place] [address]    Report Status 10/25/2021 FINAL  Final  Culture, blood (x 2)     Status: None   Collection Time: 10/20/21  7:46 PM   Specimen: BLOOD  Result Value Ref Range Status   Specimen Description BLOOD RIGHT ANTECUBITAL  Final   Special Requests   Final    BOTTLES DRAWN AEROBIC AND ANAEROBIC Blood Culture adequate volume   Culture   Final    NO GROWTH 5 DAYS Performed at [facility], [address], [place],  [address]    Report Status 10/25/2021 FINAL  Final  MRSA Next Gen by PCR, Nasal     Status: Abnormal   Collection Time: 10/21/21  5:42 PM   Specimen: Nasal Mucosa; Nasal Swab  Result Value Ref Range Status   MRSA by PCR Next Gen DETECTED (A) NOT DETECTED Final    Comment: RESULT CALLED TO, READ BACK BY AND VERIFIED WITH: Idamae Lusher @ 1937 10/21/21  LFD (NOTE) The GeneXpert MRSA Assay (FDA approved for NASAL specimens only), is one component of a comprehensive MRSA colonization surveillance program. It is not intended to diagnose MRSA infection nor to guide or monitor treatment for MRSA infections. Test performance is not FDA approved in patients less than 35 years old. Performed at [facility], [address] [place], [place] [address]   Aerobic Culture w Gram Stain (superficial specimen)     Status: None   Collection Time: 10/22/21 11:00 AM   Specimen: Leg  Result Value Ref Range Status   Specimen Description   Final    LEG Performed at [facility], [address], [place], [place] [address]    Special Requests   Final    NONE Performed at [facility], [place], [place] [address]    Gram Stain   Final    NO SQUAMOUS EPITHELIAL CELLS SEEN FEW WBC SEEN FEW GRAM POSITIVE COCCI Performed at [facility], [place] [address] [place],  [address]    Culture   Final    MODERATE METHICILLIN RESISTANT STAPHYLOCOCCUS AUREUS   Report Status 10/24/2021 FINAL  Final   Organism ID, Bacteria METHICILLIN RESISTANT STAPHYLOCOCCUS AUREUS  Final      Susceptibility   Methicillin resistant staphylococcus aureus - MIC*    CIPROFLOXACIN >=8 RESISTANT Resistant     ERYTHROMYCIN >=8 RESISTANT Resistant     GENTAMICIN <=0.5 SENSITIVE Sensitive     OXACILLIN >=4 RESISTANT Resistant     TETRACYCLINE <=1 SENSITIVE Sensitive     VANCOMYCIN 1 SENSITIVE Sensitive     TRIMETH/SULFA <=10 SENSITIVE Sensitive     CLINDAMYCIN <=0.25 SENSITIVE Sensitive     RIFAMPIN <=0.5 SENSITIVE Sensitive     Inducible Clindamycin NEGATIVE Sensitive     * MODERATE METHICILLIN RESISTANT STAPHYLOCOCCUS AUREUS     Total time spend on discharging this patient, including the last patient exam, discussing the hospital stay, instructions for ongoing care as it relates to all pertinent caregivers, as well as preparing the medical discharge records, prescriptions, and/or referrals as applicable, is 40 minutes.    Enzo Bi, MD  [facility] 10/26/2021, 10:03 AM

## 2021-10-26 NOTE — Plan of Care (Signed)

## 2021-10-27 ENCOUNTER — Other Ambulatory Visit: Payer: Self-pay

## 2021-10-27 LAB — PROTEIN ELECTROPHORESIS, SERUM
A/G Ratio: 0.9 (ref 0.7–1.7)
Albumin ELP: 2.9 g/dL (ref 2.9–4.4)
Alpha-1-Globulin: 0.3 g/dL (ref 0.0–0.4)
Alpha-2-Globulin: 0.8 g/dL (ref 0.4–1.0)
Beta Globulin: 1 g/dL (ref 0.7–1.3)
Gamma Globulin: 1.1 g/dL (ref 0.4–1.8)
Globulin, Total: 3.2 g/dL (ref 2.2–3.9)
Total Protein ELP: 6.1 g/dL (ref 6.0–8.5)

## 2021-10-31 LAB — SURGICAL PATHOLOGY

## 2021-11-23 ENCOUNTER — Other Ambulatory Visit: Payer: Self-pay

## 2021-11-23 ENCOUNTER — Ambulatory Visit: Payer: Self-pay | Admitting: Gerontology

## 2021-11-23 VITALS — BP 183/97 | HR 93 | Temp 97.7°F | Resp 16 | Ht 65.0 in | Wt 208.6 lb

## 2021-11-23 DIAGNOSIS — F411 Generalized anxiety disorder: Secondary | ICD-10-CM

## 2021-11-23 DIAGNOSIS — T3695XA Adverse effect of unspecified systemic antibiotic, initial encounter: Secondary | ICD-10-CM

## 2021-11-23 DIAGNOSIS — B379 Candidiasis, unspecified: Secondary | ICD-10-CM | POA: Insufficient documentation

## 2021-11-23 DIAGNOSIS — I10 Essential (primary) hypertension: Secondary | ICD-10-CM

## 2021-11-23 DIAGNOSIS — R600 Localized edema: Secondary | ICD-10-CM

## 2021-11-23 DIAGNOSIS — Z8659 Personal history of other mental and behavioral disorders: Secondary | ICD-10-CM | POA: Insufficient documentation

## 2021-11-23 DIAGNOSIS — Z7689 Persons encountering health services in other specified circumstances: Secondary | ICD-10-CM | POA: Insufficient documentation

## 2021-11-23 DIAGNOSIS — R768 Other specified abnormal immunological findings in serum: Secondary | ICD-10-CM

## 2021-11-23 MED ORDER — BLOOD PRESSURE KIT
1.0000 | PACK | Freq: Every day | 0 refills | Status: AC
  Filled 2021-11-23 – 2022-05-23 (×4): qty 1, fill #0

## 2021-11-23 MED ORDER — FUROSEMIDE 40 MG PO TABS
40.0000 mg | ORAL_TABLET | Freq: Every day | ORAL | 0 refills | Status: DC
  Filled 2021-11-23: qty 14, 14d supply, fill #0

## 2021-11-23 MED ORDER — FLUCONAZOLE 150 MG PO TABS
150.0000 mg | ORAL_TABLET | Freq: Every day | ORAL | 0 refills | Status: DC
  Filled 2021-11-23: qty 1, 1d supply, fill #0

## 2021-11-23 NOTE — Progress Notes (Signed)
New Patient Office Visit  Subjective:  Patient ID: Cynthia Hodge, female    DOB: 1976/05/03  Age: 46 y.o. MRN: [id number]  CC:  Chief Complaint  Patient presents with   Establish Care    HPI Cynthia Hodge is a 46 y/o female who has history of hypertension, presents to establish care and evaluation of her condition. She was discharged from the hospital on 10/26/21. During her hospital course, she was treated for MRSA infection to BLE, Severe spesis.  Currently, infection to her bilateral lower extremities has resolved and has scattered scabs.  She also states that the swelling to her bilateral lower extremities has resolved 90%, but has trace edema to BLE and mild ascites.She has one tablet of 40 mg Furosemide left. She has a history of hypertension, her blood pressure was elevated during visit. She was started on 20 mg Lisinopril during her hospital course, but she has not being taking Lisinopril. She reports taking only 50 mg of Metoprolol bid. She does not check her blood glucose at home, states that she adheres to [place]. Her ANA was positive and dsDNA, also has rosea to her cheek and was to follow up with Rheumatologist Dr Jefm Bryant. Her BNP was elevated at 569, cardiomegaly and interstitial edema chest x ray concerning for CHF. She had Echocardiogram done on 10/21/21 and it showed Left ventricular ejection fraction, by estimation, is 50 to 55%. The left ventricle has low normal function. The left ventricle has no regional wall motion abnormalities. There is mild left ventricular hypertrophy. Left ventricular diastolic parameters are consistent with Grade II diastolic dysfunction (pseudonormalization). 1. Right ventricular systolic function is normal. The right ventricular size is normal. There is moderately elevated pulmonary artery systolic pressure. 2. 3. The mitral valve is normal in structure. Mild mitral valve regurgitation. 4. Tricuspid valve regurgitation is moderate. 5. The aortic  valve is grossly normal. Aortic valve regurgitation is trivial. She denies chest pain, palpitation, shortness of breath, light headedness and vision changes. She will follow up with Cardiologist when her [facility] financial application is approved.  She reports having a history of anxiety, denies suicidal nor homicidal ideation, and requests mental health referral. She also c/o whitish pasty vaginal discharge after taking Antibiotics. She denies vaginal itching, states that she used otc anti fungal cream with no relief. Overall, she states that she's doing well and offers no further complaint.    Past Medical History:  Diagnosis Date   Hypertension     Past Surgical History:  Procedure Laterality Date   NO PAST SURGERIES      Family History  Problem Relation Age of Onset   Stroke Mother    Dementia Mother    Hypertension Father    Hypertension Sister    Diabetes Paternal Uncle    Other Maternal Grandmother        childbirth   Other Maternal Grandfather        carbon monoxide posioning   Cervical cancer Paternal Grandmother    Diabetes Paternal Grandfather     Social History   Socioeconomic History   Marital status: Unknown    Spouse name: Not on file   Number of children: Not on file   Years of education: Not on file   Highest education level: Not on file  Occupational History   Not on file  Tobacco Use   Smoking status: Never   Smokeless tobacco: Never  Vaping Use   Vaping Use: Never used  Substance and Sexual  Activity   Alcohol use: Not Currently   Drug use: Never   Sexual activity: Not on file  Other Topics Concern   Not on file  Social History Narrative   Not on file   Social Determinants of Health   Financial Resource Strain: Not on file  Food Insecurity: No Food Insecurity   Worried About Running Out of Food in the Last Year: Never true   Ran Out of Food in the Last Year: Never true  Transportation Needs: No Transportation Needs   Lack of Transportation  (Medical): No   Lack of Transportation (Non-Medical): No  Physical Activity: Not on file  Stress: Not on file  Social Connections: Not on file  Intimate Partner Violence: Not on file    ROS Review of Systems  Constitutional: Negative.   HENT: Negative.    Eyes: Negative.   Respiratory: Negative.    Cardiovascular:  Positive for leg swelling (trace edema to BLE).  Gastrointestinal: Negative.   Endocrine: Negative.   Genitourinary:  Positive for vaginal discharge.  Musculoskeletal: Negative.   Skin:        Scabs to BLL  Neurological: Negative.   Hematological: Negative.   Psychiatric/Behavioral:  The patient is nervous/anxious.    Objective:   Today's Vitals: BP (!) 183/97 (BP Location: Right Arm, Patient Position: Sitting, Cuff Size: Large)    Pulse 93    Temp 97.7 F (36.5 C) (Oral)    Resp 16    Ht _0  (1.651 m)    Wt 208 lb 9.6 oz (94.6 kg)    LMP 09/02/2021 (Approximate)    SpO2 98%    BMI 34.71 kg/m   Physical Exam HENT:     Head: Normocephalic and atraumatic.     Nose: Nose normal.     Mouth/Throat:     Mouth: Mucous membranes are moist.  Eyes:     Extraocular Movements: Extraocular movements intact.     Conjunctiva/sclera: Conjunctivae normal.     Pupils: Pupils are equal, round, and reactive to light.  Cardiovascular:     Rate and Rhythm: Normal rate and regular rhythm.     Pulses: Normal pulses.     Heart sounds: Normal heart sounds.  Pulmonary:     Effort: Pulmonary effort is normal.     Breath sounds: Normal breath sounds.  Abdominal:     General: Bowel sounds are normal.     Palpations: Abdomen is soft.  Genitourinary:    Comments: Deferred per patient Musculoskeletal:        General: Swelling (trace edema to BLE) present. Normal range of motion.     Cervical back: Normal range of motion.  Skin:    Comments: Healed  scattered scabs to BLE  Neurological:     General: No focal deficit present.     Mental Status: She is alert and oriented to  person, place, and time. Mental status is at baseline.  Psychiatric:        Mood and Affect: Mood normal.        Behavior: Behavior normal.        Thought Content: Thought content normal.        Judgment: Judgment normal.    Assessment & Plan:     1. Encounter to establish care - Routine labs will be checked - CBC w/Diff; Future - Iron Binding Cap (TIBC)([facility]); Future - Basic Metabolic Panel (BMET); Future - Basic Metabolic Panel (BMET) - Iron Binding Cap (TIBC)([facility]) - CBC w/Diff  2.  Antibiotic-induced yeast infection -She will continue on Fluconazole for yeast, she was educated on medication side effects and advised to notify clinic. - fluconazole (DIFLUCAN) 150 MG tablet; Take 1 tablet (150 mg total) by mouth once daily.  Dispense: 1 tablet; Refill: 0  3. Bilateral leg edema - She will continue 2 more weeks of 40 mg Furosemide, will check her potassium. She was advised to elevate leg while sitting down. - furosemide (LASIX) 40 MG tablet; Take 1 tablet (40 mg total) by mouth once daily.  Dispense: 14 tablet; Refill: 0  4. Generalized anxiety disorder - She will follow up with [facility] Behavioral health, advised to call the Crisis help line with worsening symptoms.  5. ANA positive - She will follow up with [facility] Rheumatologist, Dr Jefm Bryant  6. Essential hypertension - Her blood pressure was elevated during visit, her goal should be less than 140/90. She was advised to start taking 20 mg lisinopril, check her blood pressure daily, record and bring log to follow up appointment. She will continue on DASH diet and exercise as tolerated. - Blood Pressure KIT; USE AS DIRECTED DAILY.  Dispense: 1 kit; Refill: 0     Follow-up: Return in about 19 days (around 12/12/2021), or if symptoms worsen or fail to improve.   Edee Nifong Jerold Coombe, NP

## 2021-11-23 NOTE — Patient Instructions (Signed)

## 2021-11-24 ENCOUNTER — Other Ambulatory Visit: Payer: Self-pay

## 2021-11-24 LAB — CBC WITH DIFFERENTIAL/PLATELET
Basophils Absolute: 0 10*3/uL (ref 0.0–0.2)
Basos: 0 %
EOS (ABSOLUTE): 0.2 10*3/uL (ref 0.0–0.4)
Eos: 2 %
Hematocrit: 40.3 % (ref 34.0–46.6)
Hemoglobin: 12 g/dL (ref 11.1–15.9)
Immature Grans (Abs): 0 10*3/uL (ref 0.0–0.1)
Immature Granulocytes: 0 %
Lymphocytes Absolute: 2.9 10*3/uL (ref 0.7–3.1)
Lymphs: 21 %
MCH: 21.2 pg — ABNORMAL LOW (ref 26.6–33.0)
MCHC: 29.8 g/dL — ABNORMAL LOW (ref 31.5–35.7)
MCV: 71 fL — ABNORMAL LOW (ref 79–97)
Monocytes Absolute: 1.3 10*3/uL — ABNORMAL HIGH (ref 0.1–0.9)
Monocytes: 10 %
Neutrophils Absolute: 9.1 10*3/uL — ABNORMAL HIGH (ref 1.4–7.0)
Neutrophils: 67 %
Platelets: 353 10*3/uL (ref 150–450)
RBC: 5.65 x10E6/uL — ABNORMAL HIGH (ref 3.77–5.28)
RDW: 22 % — ABNORMAL HIGH (ref 11.7–15.4)
WBC: 13.5 10*3/uL — ABNORMAL HIGH (ref 3.4–10.8)

## 2021-11-24 LAB — IRON AND TIBC
Iron Saturation: 8 % — CL (ref 15–55)
Iron: 30 ug/dL (ref 27–159)
Total Iron Binding Capacity: 392 ug/dL (ref 250–450)
UIBC: 362 ug/dL (ref 131–425)

## 2021-11-24 LAB — BASIC METABOLIC PANEL
BUN/Creatinine Ratio: 24 — ABNORMAL HIGH (ref 9–23)
BUN: 19 mg/dL (ref 6–24)
CO2: 22 mmol/L (ref 20–29)
Calcium: 9.4 mg/dL (ref 8.7–10.2)
Chloride: 104 mmol/L (ref 96–106)
Creatinine, Ser: 0.8 mg/dL (ref 0.57–1.00)
Glucose: 82 mg/dL (ref 70–99)
Potassium: 4.8 mmol/L (ref 3.5–5.2)
Sodium: 140 mmol/L (ref 134–144)
eGFR: 91 mL/min/{1.73_m2} (ref >59)

## 2021-11-28 ENCOUNTER — Other Ambulatory Visit: Payer: Self-pay

## 2021-11-28 ENCOUNTER — Telehealth: Payer: Self-pay | Admitting: Licensed Clinical Social Worker

## 2021-11-28 ENCOUNTER — Ambulatory Visit: Payer: Self-pay | Admitting: Licensed Clinical Social Worker

## 2021-11-28 ENCOUNTER — Institutional Professional Consult (permissible substitution): Payer: Self-pay | Admitting: Licensed Clinical Social Worker

## 2021-11-28 DIAGNOSIS — F411 Generalized anxiety disorder: Secondary | ICD-10-CM

## 2021-11-28 NOTE — Telephone Encounter (Signed)
Called patient to remind her of her appointment for 11/29/2021 at 3:00 pm and offer space for the patient to have any of her questions or concerns addressed regarding [facility] program.

## 2021-11-28 NOTE — BH Specialist Note (Unsigned)
ADULT Comprehensive Clinical Assessment (CCA) Note   11/28/2021 Cynthia Hodge [id number]     SUBJECTIVE: Cynthia Hodge is a 46 y.o.   female accompanied by  herself   Cynthia Hodge was seen in consultation at the request of Pcp, No for evaluation of  mental health .  Types of Service: Telephone visit  Reason for referral in patient/family's own words:  The patient stated "I was referred for anxiety."     She likes to be called Cynthia Hodge.  She came to the appointment with  herself .  Primary language at home is [place].    (Patient to answer as appropriate) Gender identity: female  Sex assigned at birth: female Pronouns: she   Mental status exam:   General Appearance Cynthia Hodge:   Eye Contact:   Motor Behavior:   Speech:  Normal Level of Consciousness:  Alert Mood:  Euthymic Affect:  Appropriate Anxiety Level:  Minimal Thought Process:  Coherent Thought Content:  WNL Perception:  {BHH PERCEPTION:22311} Judgment:  Good Insight:  Present   Current Medications and therapies: She is taking:   Outpatient Encounter Medications as of 11/28/2021  Medication Sig   Blood Pressure KIT USE AS DIRECTED DAILY.   fluconazole (DIFLUCAN) 150 MG tablet Take 1 tablet (150 mg total) by mouth once daily.   furosemide (LASIX) 40 MG tablet Take 1 tablet (40 mg total) by mouth once daily.   gabapentin (NEURONTIN) 100 MG capsule Take 1 capsule (100 mg total) by mouth 3 (three) times daily for generalized anxiety disorder. (Patient not taking: Reported on 11/23/2021)   lisinopril (ZESTRIL) 20 MG tablet Take 1 tablet (20 mg total) by mouth once daily. (Patient not taking: Reported on 11/23/2021)   metoprolol tartrate (LOPRESSOR) 50 MG tablet Take 1 tablet (50 mg total) by mouth 2 (two) times daily.   No facility-administered encounter medications on file as of 11/28/2021.     Therapies:   The patient reports that she went to counseling in her 31's  Family history: Family mental  illness:   Paternal grandmother-anxiety , father alcoholism and anxiety, sister alcoholism and anxiety, paternal uncles- anxiety, Mother-possible schizophrenia, Maternal Aunt( mother's twin) -schizophrenia Maternal Aunt schizophrenia Family school achievement history:  No known history of autism, learning disability, intellectual disability Other relevant family history:  ***  Social History: Now living with father. Employment:  Not employed Main caregiver's health:   Religious or Spiritual Beliefs: ***  Negative Mood Concerns She does not make negative statements about self. Self-injury:  No Suicidal ideation:  No Suicide attempt:  No  Additional Anxiety Concerns: Panic attacks:  {CHL AMB YES/NO/NOT APPLICABLE:210130111} Obsessions:  {CHL AMB YES/NO/NOT APPLICABLE:210130111} Compulsions:  {CHL AMB YES/NO/NOT APPLICABLE:210130111}  Stressors:  {CHL AMB BH STRESSORS:[phone]}  Alcohol and/or Substance Use: Have you recently consumed alcohol? no  Have you recently used any drugs?  no  Have you recently consumed any tobacco? no Does patient seem concerned about dependence or abuse of any substance? {YES/NO/WILD CHYIF:02774}  Substance Use Disorder Checklist:  {CHL AMB BH CHECKLIST FOR SUBSTANCE USE DISORDER:[phone]}  Severity Risk Scoring based on DSM-5 Criteria for Substance Use Disorder. The presence of at least two (2) criteria in the last 12 months indicate a substance use disorder. The severity of the substance use disorder is defined as:  Mild: Presence of 2-3 criteria Moderate: Presence of 4-5 criteria Severe: Presence of 6 or more criteria  Traumatic Experiences: History or current traumatic events (natural disaster, house fire, etc.)? no History or current  physical trauma?  no History or current emotional trauma?  yes History or current sexual trauma?  no History or current domestic or intimate partner violence?  no History of bullying:  no  Risk  Assessment: Suicidal or homicidal thoughts?   no Self injurious behaviors?  no Guns in the home?  no  Self Harm Risk Factors: Unemployment  Self Harm Thoughts?: No  Patient and/or Family's Strengths/Protective Factors: {CHL AMB BH PROTECTIVE [phone]}  Patient's and/or Family's Goals in their own words: ***  Interventions: Interventions utilized:  {IBH Interventions:21014054:::0}   Patient and/or Family Response: ***  Standardized Assessments completed: GAD-7 and PHQ 9 PHQ-9 = 8 GAD-7 = 16  Coordination of Care:   DSM-5 Diagnosis: ***  Recommendations for Services/Supports/Treatments: ***  Progress towards Goals: {CHL AMB BH PROGRESS TOWARDS EUVHA:[id number]}  Treatment Plan Summary: Behavioral Health Clinician will: {CHL AMB BH TREATMENT PLAN SUMMARY THERAPIST TVVL:[id number]}  Individual will: {CHL AMB BH TREATMENT PLAN SUMMARY INDIVDUAL SQSY:7158063868}  Referral(s): [place] (In Clinic)  Lesli Albee, [place]

## 2021-11-29 ENCOUNTER — Ambulatory Visit: Payer: Self-pay | Admitting: Licensed Clinical Social Worker

## 2021-11-29 ENCOUNTER — Other Ambulatory Visit: Payer: Self-pay

## 2021-11-29 ENCOUNTER — Ambulatory Visit: Payer: Self-pay | Admitting: Pharmacy Technician

## 2021-11-29 DIAGNOSIS — Z79899 Other long term (current) drug therapy: Secondary | ICD-10-CM

## 2021-11-29 DIAGNOSIS — F411 Generalized anxiety disorder: Secondary | ICD-10-CM

## 2021-11-29 NOTE — Progress Notes (Signed)
Completed [facility] application and contract.  Patient agreed to all terms of the [facility] contract.    Patient approved to receive medication assistance until time for re-certification in 7308, and as long as eligibility criteria continues to be met.    Provided patient with community resource material based on her particular needs.    [facility]
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
## 2021-12-01 ENCOUNTER — Other Ambulatory Visit: Payer: Self-pay

## 2021-12-07 ENCOUNTER — Ambulatory Visit: Payer: Self-pay | Admitting: Licensed Clinical Social Worker

## 2021-12-07 ENCOUNTER — Other Ambulatory Visit: Payer: Self-pay

## 2021-12-07 DIAGNOSIS — F411 Generalized anxiety disorder: Secondary | ICD-10-CM
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
## 2021-12-14 ENCOUNTER — Ambulatory Visit: Payer: Self-pay | Admitting: Gerontology

## 2021-12-14 ENCOUNTER — Ambulatory Visit: Payer: Self-pay | Admitting: Licensed Clinical Social Worker

## 2021-12-14 ENCOUNTER — Other Ambulatory Visit: Payer: Self-pay

## 2021-12-14 VITALS — BP 188/105 | HR 91 | Temp 98.0°F | Resp 16 | Ht 64.5 in | Wt 209.9 lb

## 2021-12-14 DIAGNOSIS — N898 Other specified noninflammatory disorders of vagina: Secondary | ICD-10-CM | POA: Insufficient documentation

## 2021-12-14 DIAGNOSIS — F411 Generalized anxiety disorder: Secondary | ICD-10-CM

## 2021-12-14 DIAGNOSIS — I10 Essential (primary) hypertension: Secondary | ICD-10-CM

## 2021-12-14 MED ORDER — MICONAZOLE NITRATE 2 % VA CREA
1.0000 | TOPICAL_CREAM | Freq: Every day | VAGINAL | 0 refills | Status: DC
  Filled 2021-12-14: qty 45, 7d supply, fill #0

## 2021-12-14 NOTE — Progress Notes (Signed)
Established Patient Office Visit  Subjective:  Patient ID: Cynthia Hodge, female    DOB: [birth year]  Age: 46 y.o. MRN: [id number]  CC:  Chief Complaint  Patient presents with   Follow-up    Labs drawn 11/23/21    HPI Cynthia Hodge is a 46 y/o female who has history of hypertension, presents for follow up of hypertension and lab review. Her Hemoglobin and hematocrit are within normal limit, Iron saturation was 8% and her renal function and potasium are within normal limits. She is not compliant with her medications, has not started taking the 20 mg Lisinopril.She checks her blood pressure at home, states that it's high in the 180's/ 110. She denies headache, chest pain, palpitation, dizziness and vision changes. She continues to experience non pruritic, whitish vaginal discharge that's pasty in consistency.Overall, she states that she's doing well and offers no further complaint.  Past Medical History:  Diagnosis Date   Hypertension     Past Surgical History:  Procedure Laterality Date   NO PAST SURGERIES      Family History  Problem Relation Age of Onset   Stroke Mother    Dementia Mother    Hypertension Father    Hypertension Sister    Diabetes Paternal Uncle    Other Maternal Grandmother        childbirth   Other Maternal Grandfather        carbon monoxide posioning   Cervical cancer Paternal Grandmother    Diabetes Paternal Grandfather     Social History   Socioeconomic History   Marital status: Unknown    Spouse name: Not on file   Number of children: Not on file   Years of education: Not on file   Highest education level: Not on file  Occupational History   Not on file  Tobacco Use   Smoking status: Never   Smokeless tobacco: Never  Vaping Use   Vaping Use: Never used  Substance and Sexual Activity   Alcohol use: Not Currently   Drug use: Never   Sexual activity: Not on file  Other Topics Concern   Not on file  Social History Narrative   Not on  file   Social Determinants of Health   Financial Resource Strain: Not on file  Food Insecurity: No Food Insecurity   Worried About Running Out of Food in the Last Year: Never true   [place] in the Last Year: Never true  Transportation Needs: No Transportation Needs   Lack of Transportation (Medical): No   Lack of Transportation (Non-Medical): No  Physical Activity: Not on file  Stress: Not on file  Social Connections: Not on file  Intimate Partner Violence: Not on file    Outpatient Medications Prior to Visit  Medication Sig Dispense Refill   Blood Pressure KIT USE AS DIRECTED DAILY. 1 kit 0   metoprolol tartrate (LOPRESSOR) 50 MG tablet Take 1 tablet (50 mg total) by mouth 2 (two) times daily. 60 tablet 2   lisinopril (ZESTRIL) 20 MG tablet Take 1 tablet (20 mg total) by mouth once daily. (Patient not taking: Reported on 11/23/2021) 30 tablet 2   fluconazole (DIFLUCAN) 150 MG tablet Take 1 tablet (150 mg total) by mouth once daily. 1 tablet 0   furosemide (LASIX) 40 MG tablet Take 1 tablet (40 mg total) by mouth once daily. (Patient not taking: Reported on 12/14/2021) 14 tablet 0   gabapentin (NEURONTIN) 100 MG capsule Take 1 capsule (100 mg  total) by mouth 3 (three) times daily for generalized anxiety disorder. (Patient not taking: Reported on 11/23/2021) 90 capsule 2   No facility-administered medications prior to visit.    No Known Allergies  ROS Review of Systems  Constitutional: Negative.   Eyes: Negative.   Respiratory: Negative.    Cardiovascular: Negative.   Genitourinary:  Positive for vaginal discharge (whitish,non pruritic).  Neurological: Negative.   Psychiatric/Behavioral: Negative.       Objective:    Physical Exam HENT:     Head: Normocephalic and atraumatic.     Mouth/Throat:     Mouth: Mucous membranes are moist.  Eyes:     Extraocular Movements: Extraocular movements intact.     Conjunctiva/sclera: Conjunctivae normal.     Pupils: Pupils are  equal, round, and reactive to light.  Cardiovascular:     Rate and Rhythm: Normal rate and regular rhythm.     Pulses: Normal pulses.     Heart sounds: Normal heart sounds.  Pulmonary:     Effort: Pulmonary effort is normal.     Breath sounds: Normal breath sounds.  Neurological:     General: No focal deficit present.     Mental Status: She is alert and oriented to person, place, and time. Mental status is at baseline.  Psychiatric:        Mood and Affect: Mood normal.        Behavior: Behavior normal.        Thought Content: Thought content normal.        Judgment: Judgment normal.    BP (!) 188/105 (BP Location: Left Arm, Patient Position: Sitting, Cuff Size: Large)    Pulse 91    Temp 98 F (36.7 C) (Oral)    Resp 16    Ht 5' 4.5" (1.638 m)    Wt 209 lb 14.4 oz (95.2 kg)    LMP 11/30/2021 (Approximate)    SpO2 98%    BMI 35.47 kg/m  Wt Readings from Last 3 Encounters:  12/14/21 209 lb 14.4 oz (95.2 kg)  11/23/21 208 lb 9.6 oz (94.6 kg)  10/23/21 248 lb 9.6 oz (112.8 kg)   Encouraged weight loss   Health Maintenance Due  Topic Date Due   COVID-19 Vaccine (1) Never done   TETANUS/TDAP  Never done   PAP SMEAR-Modifier  Never done   COLONOSCOPY (Pts 45-67yr Insurance coverage will need to be confirmed)  Never done   INFLUENZA VACCINE  Never done    There are no preventive care reminders to display for this patient.  Lab Results  Component Value Date   TSH 1.498 10/23/2021   Lab Results  Component Value Date   WBC 13.5 (H) 11/23/2021   HGB 12.0 11/23/2021   HCT 40.3 11/23/2021   MCV 71 (L) 11/23/2021   PLT 353 11/23/2021   Lab Results  Component Value Date   NA 140 11/23/2021   K 4.8 11/23/2021   CO2 22 11/23/2021   GLUCOSE 82 11/23/2021   BUN 19 11/23/2021   CREATININE 0.80 11/23/2021   BILITOT 2.1 (H) 10/20/2021   ALKPHOS 80 10/20/2021   AST 45 (H) 10/20/2021   ALT 34 10/20/2021   PROT 7.4 10/20/2021   ALBUMIN 3.8 10/20/2021   CALCIUM 9.4 11/23/2021    ANIONGAP 9 10/26/2021   EGFR 91 11/23/2021   No results found for: CHOL No results found for: HDL No results found for: LDLCALC No results found for: TRIG No results found for: CDaybreak Of [place]Lab Results  Component Value Date   HGBA1C 6.3 (H) 10/25/2021      Assessment & Plan:    1. Essential hypertension - Her blood pressure is elevated , her goal should be less than 140/90, she was encouraged to start taking 20 mg Lisinopril, check blood pressure daily, record and bring log to follow up appointment. She was encouraged to continue on DASH diet and exercise as tolerated.  2. Vaginal discharge - She will start using Monistat, educated on medication side effects and advised to notify clinic. - miconazole (MONISTAT 7) 2 % vaginal cream; Place 1 applicatorful vaginally once daily at bedtime for 7 days.  Dispense: 45 g; Refill: 0     Follow-up: Return in about 4 weeks (around 01/11/2022), or if symptoms worsen or fail to improve.    Skipper [place] Jerold Coombe, NP
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
## 2021-12-14 NOTE — Patient Instructions (Signed)

## 2021-12-15 ENCOUNTER — Other Ambulatory Visit: Payer: Self-pay

## 2021-12-19 ENCOUNTER — Ambulatory Visit: Payer: Self-pay | Admitting: Licensed Clinical Social Worker

## 2021-12-25 ENCOUNTER — Other Ambulatory Visit: Payer: Self-pay

## 2021-12-26 ENCOUNTER — Ambulatory Visit: Payer: Self-pay | Admitting: Licensed Clinical Social Worker

## 2021-12-26 ENCOUNTER — Other Ambulatory Visit: Payer: Self-pay

## 2021-12-26 DIAGNOSIS — F411 Generalized anxiety disorder: Secondary | ICD-10-CM
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
## 2022-01-01 ENCOUNTER — Other Ambulatory Visit: Payer: Self-pay

## 2022-01-04 ENCOUNTER — Other Ambulatory Visit: Payer: Self-pay

## 2022-01-04 ENCOUNTER — Ambulatory Visit: Payer: Self-pay | Admitting: Licensed Clinical Social Worker

## 2022-01-04 DIAGNOSIS — F411 Generalized anxiety disorder: Secondary | ICD-10-CM
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
## 2022-01-09 ENCOUNTER — Other Ambulatory Visit: Payer: Self-pay

## 2022-01-09 ENCOUNTER — Encounter: Payer: Self-pay | Admitting: Rheumatology

## 2022-01-09 ENCOUNTER — Ambulatory Visit: Payer: Self-pay | Admitting: Rheumatology

## 2022-01-09 ENCOUNTER — Ambulatory Visit: Payer: Self-pay | Admitting: Gerontology

## 2022-01-09 VITALS — BP 208/135 | HR 92 | Temp 98.1°F | Ht 64.0 in | Wt 210.0 lb

## 2022-01-09 VITALS — BP 196/76 | HR 90

## 2022-01-09 DIAGNOSIS — R609 Edema, unspecified: Secondary | ICD-10-CM

## 2022-01-09 DIAGNOSIS — I10 Essential (primary) hypertension: Secondary | ICD-10-CM

## 2022-01-09 MED ORDER — METOPROLOL TARTRATE 50 MG PO TABS
50.0000 mg | ORAL_TABLET | Freq: Two times a day (BID) | ORAL | 2 refills | Status: DC
  Filled 2022-01-09 – 2022-01-25 (×2): qty 60, 30d supply, fill #0
  Filled 2022-02-24: qty 60, 30d supply, fill #1
  Filled 2022-03-18: qty 60, 30d supply, fill #2

## 2022-01-09 NOTE — Progress Notes (Signed)
[facility]  PROGRESS NOTE  Patient:Cynthia Hodge Female   DOB:1976/07/11     46 y.o.  SRP:[id number]  Visit Date: 01/09/2022  HPI:  46 year old white female.  Recent hospitalization.  Had anasarca with lower extremity edema and ascites.  Developed [place] to have cellulitis with ulcerations.  MRSA. During that hospitalization she had positive ANA and anti-DNA.  Echocardiogram showed elevated RVSP and some TR.  She had significant hypertension and edema.  She had diuresis and antibiotics.  She did not have any proteinuria.  CBC was unremarkable.  She had had no prior Raynaud's.  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  There was no suspicion for cirrhosis.  Sed rate normal normal complements.  SSB 2.  SSA negative.  Cryoglobulins and ANCA negative.  Fatty liver on abdominal ultrasound.  Right pleural effusion Since hospitalization she has lost 70 pounds of fluid weight.  Blood pressure is up today.  Some yeast infection.  No oral ulcers or photosensitivity or Raynaud's.  No shortness of breath.  No significant joint pain Describes concerns about having being poisoned in her prior attempts to involve the police without previously    Past Medical History:  Diagnosis Date   Hypertension     Past Surgical History:  Procedure Laterality Date   NO PAST SURGERIES      Social History   Tobacco Use   Smoking status: Never   Smokeless tobacco: Never  Substance Use Topics   Alcohol use: Not Currently     MEDICATIONS: Current Outpatient Medications  Medication Sig Dispense Refill   lisinopril (ZESTRIL) 20 MG tablet Take 1 tablet (20 mg total) by mouth once daily. 30 tablet 2   metoprolol tartrate (LOPRESSOR) 50 MG tablet Take 1 tablet (50 mg total) by mouth 2 (two) times daily. 60 tablet 2   Blood Pressure KIT USE AS DIRECTED DAILY. 1 kit 0   miconazole (MONISTAT 7) 2 % vaginal cream Place 1 applicatorful vaginally once daily at bedtime for 7 days. (Patient not taking:  Reported on 01/09/2022) 45 g 0   No current facility-administered medications for this visit.     ALLERGIES No Known Allergies   PHYSICAL EXAM: Blood pressure (!) 208/135, pulse 92, temperature 98.1 F (36.7 C), height _0  (1.626 m), weight 210 lb (95.3 kg), SpO2 97 %. pleasant female.  No acute distress.  Clear chest.  1+ edema.  Old lesions of the legs.  No new lesions.  No visceromegaly.  No synovitis.  No active facial rashes   ASSESSMENT: Recent hospitalization with anasarca.  Unclear etiology.  Still very hypertensive but has diuresed remarkably. Some concern for chronic liver disease with increased echogenicity on abdominal ultrasound and ascites.  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 To be seen by primary care today    G. Fayrene Fearing. MD           01/09/2022,  11:55 AM

## 2022-01-09 NOTE — Patient Instructions (Signed)

## 2022-01-09 NOTE — Progress Notes (Signed)
Established Patient Office Visit  Subjective:  Patient ID: Cynthia Hodge, female    DOB: 22-Aug-1976  Age: 46 y.o. MRN: [id number]  CC: No chief complaint on file.   HPI Cynthia Hodge presents for uncontrolled blood pressure. She states that she's complaint with her medications, checks blood pressure every other day and states that her SBP is usually in the 120's, but it fluctuates sometimes. During her visit today, her SBP was in the  200's. She states that she has not taking her blood pressure medication this morning. She admits to taking her Lisinopril and Metoprolol at 1 pm, and 2nd dose of Lopressor at 1 am. She states that she wakes up late, because she experiences difficulty falling asleep. Her blood pressure was rechecked and it was 196/76. She denies chest pain, headache, dizziness and vision changes. Overall, she states that she's doing well and offers no further complaint.  Past Medical History:  Diagnosis Date   Hypertension     Past Surgical History:  Procedure Laterality Date   NO PAST SURGERIES      Family History  Problem Relation Age of Onset   Stroke Mother    Dementia Mother    Hypertension Father    Hypertension Sister    Diabetes Paternal Uncle    Other Maternal Grandmother        childbirth   Other Maternal Grandfather        carbon monoxide posioning   Cervical cancer Paternal Grandmother    Diabetes Paternal Grandfather     Social History   Socioeconomic History   Marital status: Unknown    Spouse name: Not on file   Number of children: Not on file   Years of education: Not on file   Highest education level: Not on file  Occupational History   Not on file  Tobacco Use   Smoking status: Never   Smokeless tobacco: Never  Vaping Use   Vaping Use: Never used  Substance and Sexual Activity   Alcohol use: Not Currently   Drug use: Never   Sexual activity: Not on file  Other Topics Concern   Not on file  Social History Narrative   Not  on file   Social Determinants of Health   Financial Resource Strain: Not on file  Food Insecurity: No Food Insecurity   Worried About Running Out of Food in the Last Year: Never true   [place] in the Last Year: Never true  Transportation Needs: No Transportation Needs   Lack of Transportation (Medical): No   Lack of Transportation (Non-Medical): No  Physical Activity: Not on file  Stress: Not on file  Social Connections: Not on file  Intimate Partner Violence: Not on file    Outpatient Medications Prior to Visit  Medication Sig Dispense Refill   Blood Pressure KIT USE AS DIRECTED DAILY. 1 kit 0   lisinopril (ZESTRIL) 20 MG tablet Take 1 tablet (20 mg total) by mouth once daily. 30 tablet 2   miconazole (MONISTAT 7) 2 % vaginal cream Place 1 applicatorful vaginally once daily at bedtime for 7 days. (Patient not taking: Reported on 01/09/2022) 45 g 0   metoprolol tartrate (LOPRESSOR) 50 MG tablet Take 1 tablet (50 mg total) by mouth 2 (two) times daily. 60 tablet 2   No facility-administered medications prior to visit.    No Known Allergies  ROS Review of Systems  Eyes: Negative.   Respiratory: Negative.    Cardiovascular: Negative.  Neurological: Negative.      Objective:    Physical Exam HENT:     Head: Normocephalic and atraumatic.  Eyes:     Extraocular Movements: Extraocular movements intact.     Conjunctiva/sclera: Conjunctivae normal.     Pupils: Pupils are equal, round, and reactive to light.  Cardiovascular:     Rate and Rhythm: Normal rate and regular rhythm.     Pulses: Normal pulses.     Heart sounds: Normal heart sounds.  Pulmonary:     Effort: Pulmonary effort is normal.     Breath sounds: Normal breath sounds.  Neurological:     General: No focal deficit present.     Mental Status: She is alert and oriented to person, place, and time. Mental status is at baseline.    BP (!) 196/76 (BP Location: Left Arm, Patient Position: Sitting, Cuff  Size: Large)    Pulse 90  Wt Readings from Last 3 Encounters:  01/09/22 210 lb (95.3 kg)  12/14/21 209 lb 14.4 oz (95.2 kg)  11/23/21 208 lb 9.6 oz (94.6 kg)   Encouraged weight loss  Health Maintenance Due  Topic Date Due   COVID-19 Vaccine (1) Never done   TETANUS/TDAP  Never done   PAP SMEAR-Modifier  Never done   COLONOSCOPY (Pts 45-67yr Insurance coverage will need to be confirmed)  Never done   INFLUENZA VACCINE  Never done    There are no preventive care reminders to display for this patient.  Lab Results  Component Value Date   TSH 1.498 10/23/2021   Lab Results  Component Value Date   WBC 13.5 (H) 11/23/2021   HGB 12.0 11/23/2021   HCT 40.3 11/23/2021   MCV 71 (L) 11/23/2021   PLT 353 11/23/2021   Lab Results  Component Value Date   NA 140 11/23/2021   K 4.8 11/23/2021   CO2 22 11/23/2021   GLUCOSE 82 11/23/2021   BUN 19 11/23/2021   CREATININE 0.80 11/23/2021   BILITOT 2.1 (H) 10/20/2021   ALKPHOS 80 10/20/2021   AST 45 (H) 10/20/2021   ALT 34 10/20/2021   PROT 7.4 10/20/2021   ALBUMIN 3.8 10/20/2021   CALCIUM 9.4 11/23/2021   ANIONGAP 9 10/26/2021   EGFR 91 11/23/2021   No results found for: CHOL No results found for: HDL No results found for: LDLCALC No results found for: TRIG No results found for: CHOLHDL Lab Results  Component Value Date   HGBA1C 6.3 (H) 10/25/2021      Assessment & Plan:   1. Essential hypertension -Her blood pressure is not under control, she was advised to take 20 mg Lisinopril when she wakes up in the morning and Lopressor 1pm and 1am. She was advised to check her blood pressure, record and bring log to follow up appointment. She was encouraged to continue on DASH diet, exercise as tolerated. She was educated on signs and symptoms of Stroke and advised to go to the ED, and she verbalized understanding. - metoprolol tartrate (LOPRESSOR) 50 MG tablet; Take 1 tablet (50 mg total) by mouth 2 (two) times daily.  Dispense:  60 tablet; Refill: 2      Follow-up: Return in about 16 days (around 01/25/2022), or if symptoms worsen or fail to improve.    [name] EJerold Coombe NP

## 2022-01-10 LAB — HEPATIC FUNCTION PANEL
ALT: 17 IU/L (ref 0–32)
AST: 18 IU/L (ref 0–40)
Albumin: 4.5 g/dL (ref 3.8–4.8)
Alkaline Phosphatase: 79 IU/L (ref 44–121)
Bilirubin Total: 0.9 mg/dL (ref 0.0–1.2)
Bilirubin, Direct: 0.2 mg/dL (ref 0.00–0.40)
Total Protein: 7.1 g/dL (ref 6.0–8.5)

## 2022-01-10 LAB — ANTI-DNA ANTIBODY, DOUBLE-STRANDED: dsDNA Ab: 93 IU/mL — ABNORMAL HIGH (ref 0–9)

## 2022-01-11 ENCOUNTER — Ambulatory Visit: Payer: Self-pay | Admitting: Licensed Clinical Social Worker

## 2022-01-11 ENCOUNTER — Ambulatory Visit: Payer: Self-pay

## 2022-01-17 ENCOUNTER — Telehealth: Payer: Self-pay | Admitting: Licensed Clinical Social Worker

## 2022-01-17 ENCOUNTER — Ambulatory Visit: Payer: Self-pay | Admitting: Licensed Clinical Social Worker

## 2022-01-17 NOTE — Telephone Encounter (Signed)
Called the patient twice during today's scheduled appointment; no answer, left a voicemail with the clinic contact information so they may reschedule.   

## 2022-01-25 ENCOUNTER — Ambulatory Visit: Payer: Self-pay | Admitting: Gerontology

## 2022-01-25 ENCOUNTER — Other Ambulatory Visit: Payer: Self-pay

## 2022-02-06 ENCOUNTER — Other Ambulatory Visit: Payer: Self-pay

## 2022-02-06 ENCOUNTER — Encounter: Payer: Self-pay | Admitting: Gerontology

## 2022-02-06 ENCOUNTER — Ambulatory Visit: Payer: Self-pay | Admitting: Gerontology

## 2022-02-06 VITALS — BP 167/91 | HR 76 | Temp 97.9°F | Resp 16 | Ht 64.5 in | Wt 206.7 lb

## 2022-02-06 DIAGNOSIS — I10 Essential (primary) hypertension: Secondary | ICD-10-CM

## 2022-02-06 MED ORDER — LISINOPRIL 20 MG PO TABS
20.0000 mg | ORAL_TABLET | Freq: Every day | ORAL | 2 refills | Status: DC
  Filled 2022-02-06: qty 90, 90d supply, fill #0

## 2022-02-06 NOTE — Progress Notes (Signed)
? ?Established Patient Office Visit ? ?Subjective:  ?Patient ID: Cynthia Hodge, female    DOB: 1975-11-25  Age: 46 y.o. MRN: [id number] ? ?CC:  ?Chief Complaint  ?Patient presents with  ? Follow-up  ? Hypertension  ?  Patient brought BP log to visit  ? ? ?HPI ?Cynthia Hodge  is a 46 y/o female who has history of hypertension, presents for follow up of uncontrolled hypertension. She brought her blood pressure log from 01/24/22 and SBP ranges from 103 - 146 and DBP from 50's - 90's. She states that her blood pressure increases with eating soy sauce and salad dressing. She denies chest pain, palpitation, headache, dizziness and vision changes. Overall, she states that she's doing well and offers no further complaint. ? ?Past Medical History:  ?Diagnosis Date  ? Hypertension   ? ? ?Past Surgical History:  ?Procedure Laterality Date  ? NO PAST SURGERIES    ? ? ?Family History  ?Problem Relation Age of Onset  ? Stroke Mother   ? Dementia Mother   ? Hypertension Father   ? Hypertension Sister   ? Diabetes Paternal Uncle   ? Other Maternal Grandmother   ?     childbirth  ? Other Maternal Grandfather   ?     carbon monoxide posioning  ? Cervical cancer Paternal Grandmother   ? Diabetes Paternal Grandfather   ? ? ?Social History  ? ?Socioeconomic History  ? Marital status: Single  ?  Spouse name: Not on file  ? Number of children: Not on file  ? Years of education: Not on file  ? Highest education level: Not on file  ?Occupational History  ? Not on file  ?Tobacco Use  ? Smoking status: Never  ? Smokeless tobacco: Never  ?Vaping Use  ? Vaping Use: Never used  ?Substance and Sexual Activity  ? Alcohol use: Not Currently  ? Drug use: Never  ? Sexual activity: Not on file  ?Other Topics Concern  ? Not on file  ?Social History Narrative  ? Not on file  ? ?Social Determinants of Health  ? ?Financial Resource Strain: Not on file  ?Food Insecurity: No Food Insecurity  ? Worried About Charity fundraiser in the Last Year: Never  true  ? Ran Out of Food in the Last Year: Never true  ?Transportation Needs: No Transportation Needs  ? Lack of Transportation (Medical): No  ? Lack of Transportation (Non-Medical): No  ?Physical Activity: Not on file  ?Stress: Not on file  ?Social Connections: Not on file  ?Intimate Partner Violence: Not on file  ? ? ?Outpatient Medications Prior to Visit  ?Medication Sig Dispense Refill  ? Blood Pressure KIT USE AS DIRECTED DAILY. 1 kit 0  ? metoprolol tartrate (LOPRESSOR) 50 MG tablet Take 1 tablet (50 mg total) by mouth 2 (two) times daily. 60 tablet 2  ? lisinopril (ZESTRIL) 20 MG tablet Take 1 tablet (20 mg total) by mouth once daily. 30 tablet 2  ? miconazole (MONISTAT 7) 2 % vaginal cream Place 1 applicatorful vaginally once daily at bedtime for 7 days. (Patient not taking: Reported on 01/09/2022) 45 g 0  ? ?No facility-administered medications prior to visit.  ? ? ?No Known Allergies ? ?ROS ?Review of Systems  ?Constitutional: Negative.   ?Eyes: Negative.   ?Respiratory: Negative.    ?Cardiovascular: Negative.   ?Neurological: Negative.   ? ?  ?Objective:  ?  ?Physical Exam ?HENT:  ?   Head: Normocephalic and  atraumatic.  ?   Mouth/Throat:  ?   Mouth: Mucous membranes are moist.  ?Eyes:  ?   Extraocular Movements: Extraocular movements intact.  ?   Conjunctiva/sclera: Conjunctivae normal.  ?   Pupils: Pupils are equal, round, and reactive to light.  ?Cardiovascular:  ?   Rate and Rhythm: Normal rate and regular rhythm.  ?   Pulses: Normal pulses.  ?   Heart sounds: Normal heart sounds.  ?Pulmonary:  ?   Effort: Pulmonary effort is normal.  ?   Breath sounds: Normal breath sounds.  ?Neurological:  ?   General: No focal deficit present.  ?   Mental Status: She is alert and oriented to person, place, and time. Mental status is at baseline.  ? ? ?BP (!) 167/91 (BP Location: Left Arm, Patient Position: Sitting, Cuff Size: Large)   Pulse 76   Temp 97.9 ?F (36.6 ?C) (Oral)   Resp 16   Ht 5' 4.5" (1.638 m)   Wt  206 lb 11.2 oz (93.8 kg)   LMP 01/16/2022 (Approximate)   SpO2 97%   BMI 34.93 kg/m?  ?Wt Readings from Last 3 Encounters:  ?02/06/22 206 lb 11.2 oz (93.8 kg)  ?01/09/22 210 lb (95.3 kg)  ?12/14/21 209 lb 14.4 oz (95.2 kg)  ? ?Encouraged weight loss ? ?Health Maintenance Due  ?Topic Date Due  ? COVID-19 Vaccine (1) Never done  ? TETANUS/TDAP  Never done  ? PAP SMEAR-Modifier  Never done  ? COLONOSCOPY (Pts 45-6yr Insurance coverage will need to be confirmed)  Never done  ? INFLUENZA VACCINE  Never done  ? ? ?There are no preventive care reminders to display for this patient. ? ?Lab Results  ?Component Value Date  ? TSH 1.498 10/23/2021  ? ?Lab Results  ?Component Value Date  ? WBC 13.5 (H) 11/23/2021  ? HGB 12.0 11/23/2021  ? HCT 40.3 11/23/2021  ? MCV 71 (L) 11/23/2021  ? PLT 353 11/23/2021  ? ?Lab Results  ?Component Value Date  ? NA 140 11/23/2021  ? K 4.8 11/23/2021  ? CO2 22 11/23/2021  ? GLUCOSE 82 11/23/2021  ? BUN 19 11/23/2021  ? CREATININE 0.80 11/23/2021  ? BILITOT 0.9 01/09/2022  ? ALKPHOS 79 01/09/2022  ? AST 18 01/09/2022  ? ALT 17 01/09/2022  ? PROT 7.1 01/09/2022  ? ALBUMIN 4.5 01/09/2022  ? CALCIUM 9.4 11/23/2021  ? ANIONGAP 9 10/26/2021  ? EGFR 91 11/23/2021  ? ?No results found for: CHOL ?No results found for: HDL ?No results found for: LAldrich?No results found for: TRIG ?No results found for: CHOLHDL ?Lab Results  ?Component Value Date  ? HGBA1C 6.3 (H) 10/25/2021  ? ? ?  ?Assessment & Plan:  ? ? ?1. Essential hypertension ?- Per her log, blood pressure is improving, she will continue on current medication She was educated on DASH diet, especially reading food label, verbalized understanding. She was encouraged to exercise as tolerated. She was reminded on the signs and symptoms of stroke, and to call EMS, verbalized understanding. ?- lisinopril (ZESTRIL) 20 MG tablet; Take 1 tablet (20 mg total) by mouth once daily.  Dispense: 30 tablet; Refill: 2 ? ? ? ?Follow-up: Return in about 3 months  (around 05/09/2022), or if symptoms worsen or fail to improve.  ? ? ?Shanti Agresti EJerold Coombe NP ?

## 2022-02-06 NOTE — Patient Instructions (Signed)

## 2022-02-21 ENCOUNTER — Telehealth: Payer: Self-pay | Admitting: Licensed Clinical Social Worker

## 2022-02-21 NOTE — Telephone Encounter (Signed)
-----   Message from Heather J Pruitt, LCSWA sent at 02/20/2022  3:17 PM EDT ----- ?Regarding: Appt ?Good Day ? ?Please call patient and reschedule their missed [facility]BH appointment and make a telephone note of the attempt to reschedule if they do not answer.  ? ?Thank You ? ?

## 2022-02-21 NOTE — Telephone Encounter (Signed)
Called pt and made appointment for 4/13 at 4pm ?

## 2022-02-21 NOTE — Telephone Encounter (Deleted)
-----   Message from Gaffney sent at 02/20/2022  3:17 PM EDT ----- ?Regarding: Appt ?Good Day ? ?Please call patient and reschedule their missed IBH appointment and make a telephone note of the attempt to reschedule if they do not answer.  ? ?Thank You ? ?

## 2022-02-26 ENCOUNTER — Other Ambulatory Visit: Payer: Self-pay

## 2022-03-01 ENCOUNTER — Ambulatory Visit: Payer: Self-pay | Admitting: Licensed Clinical Social Worker

## 2022-03-01 ENCOUNTER — Telehealth: Payer: Self-pay | Admitting: Licensed Clinical Social Worker

## 2022-03-01 DIAGNOSIS — F411 Generalized anxiety disorder: Secondary | ICD-10-CM

## 2022-03-01 NOTE — Telephone Encounter (Signed)
Called the patient twice during today's scheduled appointment; no answer, left a voicemail with the clinic contact information so they may reschedule.   

## 2022-03-01 NOTE — BH Specialist Note (Signed)
Integrated Behavioral Health via Telemedicine Visit ? ?03/01/2022 ?Cynthia Hodge ?[id number] ? ? ? ?Referring Provider: Carlyon Shadow, NP  ?Patient/Family location: The patient's home  ?[facility] Provider location: [facility] [place] ?All persons participating in visit: Mikhaila Roh. Llerena and [employer], LCSW-A ?Types of Service: Telephone visit ? ?I connected with Cynthia Hodge via  Telephone or Video Enabled Telemedicine Application  (Video is Tree surgeon) and verified that I am speaking with the correct person using two identifiers. Discussed confidentiality: Yes  ? ?I discussed the limitations of telemedicine and the availability of in person appointments.  Discussed there is a possibility of technology failure and discussed alternative modes of communication if that failure occurs. ? ?Patient and/or legal guardian expressed understanding and consented to Telemedicine visit: Yes  ? ?Presenting Concerns: ?Patient and/or family reports the following symptoms/concerns: The patient reports that she has been doing better since her last follow-up appointment. She noted that she continues to make small progress in healing and feels like she is turning a corner. She discussed situational stressors impacting her current life. She shared previous trauma history regarding past relationships and the impact they have on her today. She shared her family dynamics and her struggle to feel like she belonged in her family. She noted that she has always felt like an outsider. The patient shared that she has been walking for self care and can almost make it a block now. The patient denied suicidal or homicidal thoughts.  ?Duration of problem: Years; Severity of problem: moderate ? ?Patient and/or Family's Strengths/Protective Factors: ?Concrete supports in place (healthy food, safe environments, etc.) and Sense of purpose ? ?Goals Addressed: ?Patient will: ? Reduce symptoms of: agitation, anxiety,  depression, and stress  ? Increase knowledge and/or ability of: coping skills, healthy habits, self-management skills, and stress reduction  ? Demonstrate ability to: Increase healthy adjustment to current life circumstances ? ?Progress towards Goals: ?Ongoing ? ?Interventions: ?Interventions utilized:  Supportive Counselingwas utilized by the clinician during today's follow up session. Clinician met with patient to identify needs related to stressors and functioning, and assess and monitor for signs and symptoms of anxiety and depression, and assess safety. The clinician processed with the patient how they have been doing since the last follow-up session. Clinician measured the patient's depression and anxiety on a numerical scale. Clinician provided a safe judgement free space for the patient to vent her frustrations regarding her current life circumstances. The clinician encouraged the patient to utilize their coping skills to deal with their current life circumstances. The session ended with scheduling.  Standardized Assessments completed: GAD-7 and PHQ 9 ?GAD-7= 13 ?PHQ-9= 17 ? ?Assessment: ?Patient currently experiencing see above.  ? ?Patient may benefit from see above. ? ?Plan: ?Follow up with behavioral health clinician on : 03/13/2022 at 12:00 noon ?Behavioral recommendations:  ?Referral(s): [place] (In Clinic) ? ?I discussed the assessment and treatment plan with the patient and/or parent/guardian. They were provided an opportunity to ask questions and all were answered. They agreed with the plan and demonstrated an understanding of the instructions. ?  ?They were advised to call back or seek an in-person evaluation if the symptoms worsen or if the condition fails to improve as anticipated. ? ?Lesli Albee, LCSWA ?

## 2022-03-13 ENCOUNTER — Ambulatory Visit: Payer: Self-pay | Admitting: Licensed Clinical Social Worker

## 2022-03-19 ENCOUNTER — Other Ambulatory Visit: Payer: Self-pay

## 2022-03-20 ENCOUNTER — Ambulatory Visit: Payer: Self-pay | Admitting: Licensed Clinical Social Worker

## 2022-03-23 ENCOUNTER — Other Ambulatory Visit: Payer: Self-pay

## 2022-03-29 ENCOUNTER — Ambulatory Visit: Payer: Self-pay | Admitting: Licensed Clinical Social Worker

## 2022-03-29 DIAGNOSIS — F411 Generalized anxiety disorder: Secondary | ICD-10-CM

## 2022-03-29 NOTE — BH Specialist Note (Signed)
Integrated Behavioral Health via Telemedicine Visit  03/29/2022 ANDREW SORIA [id number]   Referring Provider: Carlyon Shadow, NP  Patient/Family location: The patient's home [facility] Provider location: [facility] [place] All persons participating in visit: Milissa Fesperman. [place] and [employer], LCSW-A Types of Service: Telephone visit  I connected with [name] via  Telephone or [employer]  (Video is Caregility application) and verified that I am speaking with the correct person using two identifiers. Discussed confidentiality: Yes   I discussed the limitations of telemedicine and the availability of in person appointments.  Discussed there is a possibility of technology failure and discussed alternative modes of communication if that failure occurs.  Patient and/or legal guardian expressed understanding and consented to Telemedicine visit: Yes   Presenting Concerns: Patient and/or family reports the following symptoms/concerns: The patient reports that she has been doing the same since her last visit. Zamoria shared that she continues to work to increase her ability to walk around her block. She noted that while she feels she is making progress and is proud of how far she has come in terms of recovery she feels pressured by her sister to do more. The patient discussed family stressors impacting her life currently. She noted that she is looking forward to completing the work on her late mother's estate. The patient denied any suicidal or homicidal thoughts.  Duration of problem: Years; Severity of problem: moderate  Patient and/or Family's Strengths/Protective Factors: Concrete supports in place (healthy food, safe environments, etc.) and Sense of purpose  Goals Addressed: Patient will:  Reduce symptoms of: agitation, anxiety, and stress   Increase knowledge and/or ability of: coping skills, healthy habits, self-management skills, and  stress reduction   Demonstrate ability to: Increase healthy adjustment to current life circumstances and Increase adequate support systems for patient/family  Progress towards Goals: Ongoing  Interventions: Interventions utilized:  CBT Cognitive Behavioral Therapy was utilized by the clinician during today's follow up session. Clinician met with patient to identify needs related to stressors and functioning, and assess and monitor for signs and symptoms of anxiety and depression, and assess safety. The clinician processed with the patient how they have been doing since the last follow-up session. Clinician measured patient's anxiety and depression on a numerical scale. Clinician measured the patient anxiety  and depression on a numerical scale.  Clinician continued to teach the patient calming relaxation and mindfulness skills and how to discriminate better between relaxation and tension and taught the client how to apply the skills in their daily life. Clinician assigned the patient homework in which they will use mindfulness skills daily gradually applying them progressively from not anxiety provoking to anxiety provoking situations and worked with the patient to resolve obstacles toward sustained implementation. The session ended with scheduling.  Standardized Assessments completed: GAD-7 and PHQ 9 GAD-7= 16 PHQ-9= 09  Assessment: Patient currently experiencing see above.   Patient may benefit from see above.  Plan: Follow up with behavioral health clinician on : 04/04/2022 at 12 noon Behavioral recommendations:  Referral(s): [place] (In Clinic)  I discussed the assessment and treatment plan with the patient and/or parent/guardian. They were provided an opportunity to ask questions and all were answered. They agreed with the plan and demonstrated an understanding of the instructions.   They were advised to call back or seek an in-person evaluation if the  symptoms worsen or if the condition fails to improve as anticipated.  Lesli Albee,  LCSWA

## 2022-04-03 ENCOUNTER — Other Ambulatory Visit: Payer: Self-pay | Admitting: Gerontology

## 2022-04-03 DIAGNOSIS — Z0000 Encounter for general adult medical examination without abnormal findings: Secondary | ICD-10-CM

## 2022-04-04 ENCOUNTER — Ambulatory Visit: Payer: Self-pay | Admitting: Licensed Clinical Social Worker

## 2022-04-04 DIAGNOSIS — F411 Generalized anxiety disorder: Secondary | ICD-10-CM
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
## 2022-04-10 ENCOUNTER — Other Ambulatory Visit: Payer: Self-pay

## 2022-04-10 ENCOUNTER — Other Ambulatory Visit: Payer: Self-pay | Admitting: Gerontology

## 2022-04-10 DIAGNOSIS — F411 Generalized anxiety disorder: Secondary | ICD-10-CM

## 2022-04-10 MED ORDER — ARIPIPRAZOLE 2 MG PO TABS
2.0000 mg | ORAL_TABLET | Freq: Every day | ORAL | 0 refills | Status: DC
  Filled 2022-04-10: qty 30, 30d supply, fill #0

## 2022-04-10 MED ORDER — ARIPIPRAZOLE 5 MG PO TABS
2.5000 mg | ORAL_TABLET | Freq: Every day | ORAL | 0 refills | Status: DC
  Filled 2022-04-10: qty 15, 30d supply, fill #0

## 2022-04-11 ENCOUNTER — Telehealth: Payer: Self-pay | Admitting: Licensed Clinical Social Worker

## 2022-04-11 ENCOUNTER — Ambulatory Visit: Payer: Self-pay | Admitting: Licensed Clinical Social Worker

## 2022-04-11 NOTE — Telephone Encounter (Signed)
Called the patient three times during today's scheduled appointment; no answer, unable to leave a voicemail.

## 2022-04-25 ENCOUNTER — Other Ambulatory Visit: Payer: Self-pay

## 2022-04-25 ENCOUNTER — Other Ambulatory Visit: Payer: Self-pay | Admitting: Emergency Medicine

## 2022-04-25 DIAGNOSIS — I10 Essential (primary) hypertension: Secondary | ICD-10-CM

## 2022-04-25 MED ORDER — LISINOPRIL 20 MG PO TABS
20.0000 mg | ORAL_TABLET | Freq: Every day | ORAL | 0 refills | Status: DC
  Filled 2022-04-25: qty 30, 30d supply, fill #0

## 2022-04-25 MED ORDER — METOPROLOL TARTRATE 50 MG PO TABS
50.0000 mg | ORAL_TABLET | Freq: Two times a day (BID) | ORAL | 0 refills | Status: DC
  Filled 2022-04-25: qty 60, 30d supply, fill #0

## 2022-05-08 ENCOUNTER — Ambulatory Visit: Payer: Self-pay | Admitting: Licensed Clinical Social Worker

## 2022-05-08 ENCOUNTER — Other Ambulatory Visit: Payer: Self-pay

## 2022-05-08 ENCOUNTER — Other Ambulatory Visit: Payer: Self-pay | Admitting: Gerontology

## 2022-05-08 DIAGNOSIS — F411 Generalized anxiety disorder: Secondary | ICD-10-CM

## 2022-05-08 MED ORDER — ARIPIPRAZOLE 5 MG PO TABS
2.5000 mg | ORAL_TABLET | Freq: Every day | ORAL | 0 refills | Status: DC
  Filled 2022-05-08: qty 15, 30d supply, fill #0

## 2022-05-09 ENCOUNTER — Ambulatory Visit: Payer: Self-pay | Admitting: Gerontology

## 2022-05-15 ENCOUNTER — Other Ambulatory Visit: Payer: Self-pay

## 2022-05-15 ENCOUNTER — Encounter: Payer: Self-pay | Admitting: Gerontology

## 2022-05-15 ENCOUNTER — Ambulatory Visit: Payer: Self-pay | Admitting: Gerontology

## 2022-05-15 VITALS — BP 164/84 | HR 93 | Temp 97.7°F | Resp 16 | Ht 64.5 in | Wt 222.1 lb

## 2022-05-15 DIAGNOSIS — I10 Essential (primary) hypertension: Secondary | ICD-10-CM

## 2022-05-15 DIAGNOSIS — Z8659 Personal history of other mental and behavioral disorders: Secondary | ICD-10-CM

## 2022-05-15 DIAGNOSIS — Z0000 Encounter for general adult medical examination without abnormal findings: Secondary | ICD-10-CM

## 2022-05-15 MED ORDER — METOPROLOL TARTRATE 50 MG PO TABS
50.0000 mg | ORAL_TABLET | Freq: Two times a day (BID) | ORAL | 2 refills | Status: DC
  Filled 2022-05-15 – 2022-05-23 (×2): qty 60, 30d supply, fill #0
  Filled 2022-06-22 (×2): qty 60, 30d supply, fill #1

## 2022-05-15 MED ORDER — LISINOPRIL 20 MG PO TABS
20.0000 mg | ORAL_TABLET | Freq: Every day | ORAL | 2 refills | Status: DC
  Filled 2022-05-15 – 2022-05-23 (×2): qty 30, 30d supply, fill #0

## 2022-05-16 LAB — HEMOGLOBIN A1C
Est. average glucose Bld gHb Est-mCnc: 120 mg/dL
Hgb A1c MFr Bld: 5.8 % — ABNORMAL HIGH (ref 4.8–5.6)

## 2022-05-16 LAB — CBC WITH DIFFERENTIAL/PLATELET
Basophils Absolute: 0 10*3/uL (ref 0.0–0.2)
Basos: 0 %
EOS (ABSOLUTE): 0.1 10*3/uL (ref 0.0–0.4)
Eos: 1 %
Hematocrit: 44.8 % (ref 34.0–46.6)
Hemoglobin: 14.4 g/dL (ref 11.1–15.9)
Immature Grans (Abs): 0 10*3/uL (ref 0.0–0.1)
Immature Granulocytes: 0 %
Lymphocytes Absolute: 2.6 10*3/uL (ref 0.7–3.1)
Lymphs: 24 %
MCH: 28 pg (ref 26.6–33.0)
MCHC: 32.1 g/dL (ref 31.5–35.7)
MCV: 87 fL (ref 79–97)
Monocytes Absolute: 0.9 10*3/uL (ref 0.1–0.9)
Monocytes: 8 %
Neutrophils Absolute: 7.3 10*3/uL — ABNORMAL HIGH (ref 1.4–7.0)
Neutrophils: 67 %
Platelets: 322 10*3/uL (ref 150–450)
RBC: 5.14 x10E6/uL (ref 3.77–5.28)
RDW: 13.9 % (ref 11.7–15.4)
WBC: 11 10*3/uL — ABNORMAL HIGH (ref 3.4–10.8)

## 2022-05-16 LAB — COMPREHENSIVE METABOLIC PANEL
ALT: 22 IU/L (ref 0–32)
AST: 20 IU/L (ref 0–40)
Albumin/Globulin Ratio: 1.4 (ref 1.2–2.2)
Albumin: 4.1 g/dL (ref 3.8–4.8)
Alkaline Phosphatase: 76 IU/L (ref 44–121)
BUN/Creatinine Ratio: 18 (ref 9–23)
BUN: 15 mg/dL (ref 6–24)
Bilirubin Total: 0.9 mg/dL (ref 0.0–1.2)
CO2: 21 mmol/L (ref 20–29)
Calcium: 9.4 mg/dL (ref 8.7–10.2)
Chloride: 105 mmol/L (ref 96–106)
Creatinine, Ser: 0.84 mg/dL (ref 0.57–1.00)
Globulin, Total: 3 g/dL (ref 1.5–4.5)
Glucose: 111 mg/dL — ABNORMAL HIGH (ref 70–99)
Potassium: 4.9 mmol/L (ref 3.5–5.2)
Sodium: 140 mmol/L (ref 134–144)
Total Protein: 7.1 g/dL (ref 6.0–8.5)
eGFR: 86 mL/min/{1.73_m2} (ref >59)

## 2022-05-16 LAB — LIPID PANEL
Chol/HDL Ratio: 5.5 ratio — ABNORMAL HIGH (ref 0.0–4.4)
Cholesterol, Total: 209 mg/dL — ABNORMAL HIGH (ref 100–199)
HDL: 38 mg/dL — ABNORMAL LOW (ref >39)
LDL Chol Calc (NIH): 138 mg/dL — ABNORMAL HIGH (ref 0–99)
Triglycerides: 180 mg/dL — ABNORMAL HIGH (ref 0–149)
VLDL Cholesterol Cal: 33 mg/dL (ref 5–40)

## 2022-05-24 ENCOUNTER — Other Ambulatory Visit: Payer: Self-pay

## 2022-06-05 ENCOUNTER — Other Ambulatory Visit: Payer: Self-pay

## 2022-06-05 ENCOUNTER — Other Ambulatory Visit: Payer: Self-pay | Admitting: Gerontology

## 2022-06-05 DIAGNOSIS — F411 Generalized anxiety disorder: Secondary | ICD-10-CM

## 2022-06-05 MED ORDER — ARIPIPRAZOLE 5 MG PO TABS
2.5000 mg | ORAL_TABLET | Freq: Every day | ORAL | 0 refills | Status: DC
  Filled 2022-06-05: qty 15, 30d supply, fill #0

## 2022-06-12 ENCOUNTER — Ambulatory Visit: Payer: Self-pay | Admitting: Gerontology

## 2022-06-19 ENCOUNTER — Ambulatory Visit: Payer: Self-pay | Admitting: Gerontology

## 2022-06-22 ENCOUNTER — Other Ambulatory Visit (HOSPITAL_COMMUNITY): Payer: Self-pay

## 2022-06-22 ENCOUNTER — Other Ambulatory Visit: Payer: Self-pay

## 2022-06-26 ENCOUNTER — Other Ambulatory Visit: Payer: Self-pay

## 2022-06-26 ENCOUNTER — Ambulatory Visit: Payer: Self-pay | Admitting: Gerontology

## 2022-06-26 ENCOUNTER — Encounter: Payer: Self-pay | Admitting: Gerontology

## 2022-06-26 VITALS — BP 112/67 | HR 83 | Temp 98.4°F | Resp 16 | Ht 64.5 in | Wt 227.7 lb

## 2022-06-26 DIAGNOSIS — F411 Generalized anxiety disorder: Secondary | ICD-10-CM

## 2022-06-26 DIAGNOSIS — E785 Hyperlipidemia, unspecified: Secondary | ICD-10-CM

## 2022-06-26 DIAGNOSIS — R7303 Prediabetes: Secondary | ICD-10-CM | POA: Insufficient documentation

## 2022-06-26 DIAGNOSIS — I10 Essential (primary) hypertension: Secondary | ICD-10-CM

## 2022-06-26 MED ORDER — METOPROLOL TARTRATE 50 MG PO TABS
50.0000 mg | ORAL_TABLET | Freq: Two times a day (BID) | ORAL | 2 refills | Status: DC
  Filled 2022-06-26 – 2022-07-27 (×3): qty 60, 30d supply, fill #0
  Filled 2022-08-24: qty 60, 30d supply, fill #1
  Filled 2022-09-23: qty 60, 30d supply, fill #2

## 2022-06-26 MED ORDER — LISINOPRIL 20 MG PO TABS
20.0000 mg | ORAL_TABLET | Freq: Every day | ORAL | 2 refills | Status: DC
Start: 2022-06-26 — End: 2022-10-03
  Filled 2022-06-26: qty 30, 30d supply, fill #0
  Filled 2022-07-24: qty 30, 30d supply, fill #1
  Filled 2022-09-02: qty 30, 30d supply, fill #2

## 2022-06-26 NOTE — Patient Instructions (Signed)
Calorie Counting for Weight Loss Calories are units of energy. Your body needs a certain number of calories from food to keep going throughout the day. When you eat or drink more calories than your body needs, your body stores the extra calories mostly as fat. When you eat or drink fewer calories than your body needs, your body burns fat to get the energy it needs. Calorie counting means keeping track of how many calories you eat and drink each day. Calorie counting can be helpful if you need to lose weight. If you eat fewer calories than your body needs, you should lose weight. Ask your health care provider what a healthy weight is for you. For calorie counting to work, you will need to eat the right number of calories each day to lose a healthy amount of weight per week. A dietitian can help you figure out how many calories you need in a day and will suggest ways to reach your calorie goal. A healthy amount of weight to lose each week is usually 1-2 lb (0.5-0.9 kg). This usually means that your daily calorie intake should be reduced by 500-750 calories. Eating 1,200-1,500 calories a day can help most women lose weight. Eating 1,500-1,800 calories a day can help most men lose weight. What do I need to know about calorie counting? Work with your health care provider or dietitian to determine how many calories you should get each day. To meet your daily calorie goal, you will need to: Find out how many calories are in each food that you would like to eat. Try to do this before you eat. Decide how much of the food you plan to eat. Keep a food log. Do this by writing down what you ate and how many calories it had. To successfully lose weight, it is important to balance calorie counting with a healthy lifestyle that includes regular activity. Where do I find calorie information?  The number of calories in a food can be found on a Nutrition Facts label. If a food does not have a Nutrition Facts label, try  to look up the calories online or ask your dietitian for help. Remember that calories are listed per serving. If you choose to have more than one serving of a food, you will have to multiply the calories per serving by the number of servings you plan to eat. For example, the label on a package of bread might say that a serving size is 1 slice and that there are 90 calories in a serving. If you eat 1 slice, you will have eaten 90 calories. If you eat 2 slices, you will have eaten 180 calories. How do I keep a food log? After each time that you eat, record the following in your food log as soon as possible: What you ate. Be sure to include toppings, sauces, and other extras on the food. How much you ate. This can be measured in cups, ounces, or number of items. How many calories were in each food and drink. The total number of calories in the food you ate. Keep your food log near you, such as in a pocket-sized notebook or on an app or website on your mobile phone. Some programs will calculate calories for you and show you how many calories you have left to meet your daily goal. What are some portion-control tips? Know how many calories are in a serving. This will help you know how many servings you can have of a certain   food. Use a measuring cup to measure serving sizes. You could also try weighing out portions on a kitchen scale. With time, you will be able to estimate serving sizes for some foods. Take time to put servings of different foods on your favorite plates or in your favorite bowls and cups so you know what a serving looks like. Try not to eat straight from a food's packaging, such as from a bag or box. Eating straight from the package makes it hard to see how much you are eating and can lead to overeating. Put the amount you would like to eat in a cup or on a plate to make sure you are eating the right portion. Use smaller plates, glasses, and bowls for smaller portions and to prevent  overeating. Try not to multitask. For example, avoid watching TV or using your computer while eating. If it is time to eat, sit down at a table and enjoy your food. This will help you recognize when you are full. It will also help you be more mindful of what and how much you are eating. What are tips for following this plan? Reading food labels Check the calorie count compared with the serving size. The serving size may be smaller than what you are used to eating. Check the source of the calories. Try to choose foods that are high in protein, fiber, and vitamins, and low in saturated fat, trans fat, and sodium. Shopping Read nutrition labels while you shop. This will help you make healthy decisions about which foods to buy. Pay attention to nutrition labels for low-fat or fat-free foods. These foods sometimes have the same number of calories or more calories than the full-fat versions. They also often have added sugar, starch, or salt to make up for flavor that was removed with the fat. Make a grocery list of lower-calorie foods and stick to it. Cooking Try to cook your favorite foods in a healthier way. For example, try baking instead of frying. Use low-fat dairy products. Meal planning Use more fruits and vegetables. One-half of your plate should be fruits and vegetables. Include lean proteins, such as chicken, turkey, and fish. Lifestyle Each week, aim to do one of the following: 150 minutes of moderate exercise, such as walking. 75 minutes of vigorous exercise, such as running. General information Know how many calories are in the foods you eat most often. This will help you calculate calorie counts faster. Find a way of tracking calories that works for you. Get creative. Try different apps or programs if writing down calories does not work for you. What foods should I eat?  Eat nutritious foods. It is better to have a nutritious, high-calorie food, such as an avocado, than a food with  few nutrients, such as a bag of potato chips. Use your calories on foods and drinks that will fill you up and will not leave you hungry soon after eating. Examples of foods that fill you up are nuts and nut butters, vegetables, lean proteins, and high-fiber foods such as whole grains. High-fiber foods are foods with more than 5 g of fiber per serving. Pay attention to calories in drinks. Low-calorie drinks include water and unsweetened drinks. The items listed above may not be a complete list of foods and beverages you can eat. Contact a dietitian for more information. What foods should I limit? Limit foods or drinks that are not good sources of vitamins, minerals, or protein or that are high in unhealthy fats. These   include: Candy. Other sweets. Sodas, specialty coffee drinks, alcohol, and juice. The items listed above may not be a complete list of foods and beverages you should avoid. Contact a dietitian for more information. How do I count calories when eating out? Pay attention to portions. Often, portions are much larger when eating out. Try these tips to keep portions smaller: Consider sharing a meal instead of getting your own. If you get your own meal, eat only half of it. Before you start eating, ask for a container and put half of your meal into it. When available, consider ordering smaller portions from the menu instead of full portions. Pay attention to your food and drink choices. Knowing the way food is cooked and what is included with the meal can help you eat fewer calories. If calories are listed on the menu, choose the lower-calorie options. Choose dishes that include vegetables, fruits, whole grains, low-fat dairy products, and lean proteins. Choose items that are boiled, broiled, grilled, or steamed. Avoid items that are buttered, battered, fried, or served with cream sauce. Items labeled as crispy are usually fried, unless stated otherwise. Choose water, low-fat milk,  unsweetened iced tea, or other drinks without added sugar. If you want an alcoholic beverage, choose a lower-calorie option, such as a glass of wine or light beer. Ask for dressings, sauces, and syrups on the side. These are usually high in calories, so you should limit the amount you eat. If you want a salad, choose a garden salad and ask for grilled meats. Avoid extra toppings such as bacon, cheese, or fried items. Ask for the dressing on the side, or ask for olive oil and vinegar or lemon to use as dressing. Estimate how many servings of a food you are given. Knowing serving sizes will help you be aware of how much food you are eating at restaurants. Where to find more information Centers for Disease Control and Prevention: FootballExhibition.com.br U.S. Department of Agriculture: WrestlingReporter.dk Summary Calorie counting means keeping track of how many calories you eat and drink each day. If you eat fewer calories than your body needs, you should lose weight. A healthy amount of weight to lose per week is usually 1-2 lb (0.5-0.9 kg). This usually means reducing your daily calorie intake by 500-750 calories. The number of calories in a food can be found on a Nutrition Facts label. If a food does not have a Nutrition Facts label, try to look up the calories online or ask your dietitian for help. Use smaller plates, glasses, and bowls for smaller portions and to prevent overeating. Use your calories on foods and drinks that will fill you up and not leave you hungry shortly after a meal. This information is not intended to replace advice given to you by your health care provider. Make sure you discuss any questions you have with your health care provider. Document Revised: 12/17/2019 Document Reviewed: 12/17/2019 Elsevier Patient Education  2023 Elsevier Inc. DASH Eating Plan DASH stands for Dietary Approaches to Stop Hypertension. The DASH eating plan is a healthy eating plan that has been shown to: Reduce high  blood pressure (hypertension). Reduce your risk for type 2 diabetes, heart disease, and stroke. Help with weight loss. What are tips for following this plan? Reading food labels Check food labels for the amount of salt (sodium) per serving. Choose foods with less than 5 percent of the Daily Value of sodium. Generally, foods with less than 300 milligrams (mg) of sodium per serving fit into  eating plan. To find whole grains, look for the word "whole" as the first word in the ingredient list. Shopping Buy products labeled as "low-sodium" or "no salt added." Buy fresh foods. Avoid canned foods and pre-made or frozen meals. Cooking Avoid adding salt when cooking. Use salt-free seasonings or herbs instead of table salt or sea salt. Check with your health care provider or pharmacist before using salt substitutes. Do not fry foods. Cook foods using healthy methods such as baking, boiling, grilling, roasting, and broiling instead. Cook with heart-healthy oils, such as olive, canola, avocado, soybean, or sunflower oil. Meal planning  Eat a balanced diet that includes: 4 or more servings of fruits and 4 or more servings of vegetables each day. Try to fill one-half of your plate with fruits and vegetables. 6-8 servings of whole grains each day. Less than 6 oz (170 g) of lean meat, poultry, or fish each day. A 3-oz (85-g) serving of meat is about the same size as a deck of cards. One egg equals 1 oz (28 g). 2-3 servings of low-fat dairy each day. One serving is 1 cup (237 mL). 1 serving of nuts, seeds, or beans 5 times each week. 2-3 servings of heart-healthy fats. Healthy fats called omega-3 fatty acids are found in foods such as walnuts, flaxseeds, fortified milks, and eggs. These fats are also found in cold-water fish, such as sardines, salmon, and mackerel. Limit how much you eat of: Canned or prepackaged foods. Food that is high in trans fat, such as some fried foods. Food that is high in  saturated fat, such as fatty meat. Desserts and other sweets, sugary drinks, and other foods with added sugar. Full-fat dairy products. Do not salt foods before eating. Do not eat more than 4 egg yolks a week. Try to eat at least 2 vegetarian meals a week. Eat more home-cooked food and less restaurant, buffet, and fast food. Lifestyle When eating at a restaurant, ask that your food be prepared with less salt or no salt, if possible. If you drink alcohol: Limit how much you use to: 0-1 drink a day for women who are not pregnant. 0-2 drinks a day for men. Be aware of how much alcohol is in your drink. In the [place] one drink equals one 12 oz bottle of beer (355 mL), one 5 oz glass of wine (148 mL), or one 1 oz glass of hard liquor (44 mL). General information Avoid eating more than 2,300 mg of salt a day. If you have hypertension, you may need to reduce your sodium intake to 1,500 mg a day. Work with your health care provider to maintain a healthy body weight or to lose weight. Ask what an ideal weight is for you. Get at least 30 minutes of exercise that causes your heart to beat faster (aerobic exercise) most days of the week. Activities may include walking, swimming, or biking. Work with your health care provider or dietitian to adjust your eating plan to your individual calorie needs. What foods should I eat? Fruits All fresh, dried, or frozen fruit. Canned fruit in natural juice (without added sugar). Vegetables Fresh or frozen vegetables (raw, steamed, roasted, or grilled). Low-sodium or reduced-sodium tomato and vegetable juice. Low-sodium or reduced-sodium tomato sauce and tomato paste. Low-sodium or reduced-sodium canned vegetables. Grains Whole-grain or whole-wheat bread. Whole-grain or whole-wheat pasta. Brown rice. Oatmeal. Quinoa. Bulgur. Whole-grain and low-sodium cereals. Pita bread. Low-fat, low-sodium crackers. Whole-wheat flour tortillas. Meats and other proteins Skinless  chicken or turkey.   [place]. Ground chicken or [place]. Pork with fat trimmed off. Fish and seafood. Egg whites. Dried beans, peas, or lentils. Unsalted nuts, nut butters, and seeds. Unsalted canned beans. Lean cuts of beef with fat trimmed off. Low-sodium, lean precooked or cured meat, such as sausages or meat loaves. Dairy Low-fat (1%) or fat-free (skim) milk. Reduced-fat, low-fat, or fat-free cheeses. Nonfat, low-sodium ricotta or cottage cheese. Low-fat or nonfat yogurt. Low-fat, low-sodium cheese. Fats and oils Soft margarine without trans fats. Vegetable oil. Reduced-fat, low-fat, or light mayonnaise and salad dressings (reduced-sodium). Canola, safflower, olive, avocado, soybean, and sunflower oils. Avocado. Seasonings and condiments Herbs. Spices. Seasoning mixes without salt. Other foods Unsalted popcorn and pretzels. Fat-free sweets. The items listed above may not be a complete list of foods and beverages you can eat. Contact a dietitian for more information. What foods should I avoid? Fruits Canned fruit in a light or heavy syrup. Fried fruit. Fruit in cream or butter sauce. Vegetables Creamed or fried vegetables. Vegetables in a cheese sauce. Regular canned vegetables (not low-sodium or reduced-sodium). Regular canned tomato sauce and paste (not low-sodium or reduced-sodium). Regular tomato and vegetable juice (not low-sodium or reduced-sodium). Rosita Fire. Olives. Grains Baked goods made with fat, such as croissants, muffins, or some breads. Dry pasta or rice meal packs. Meats and other proteins Fatty cuts of meat. Ribs. Fried meat. Tomasa Blase. Bologna, salami, and other precooked or cured meats, such as sausages or meat loaves. Fat from the back of a pig (fatback). Bratwurst. Salted nuts and seeds. Canned beans with added salt. Canned or smoked fish. Whole eggs or egg yolks. Chicken or [place] with skin. Dairy Whole or 2% milk, cream, and half-and-half. Whole or full-fat cream cheese. Whole-fat or  sweetened yogurt. Full-fat cheese. Nondairy creamers. Whipped toppings. Processed cheese and cheese spreads. Fats and oils Butter. Stick margarine. Lard. Shortening. Ghee. Bacon fat. Tropical oils, such as coconut, palm kernel, or palm oil. Seasonings and condiments Onion salt, garlic salt, seasoned salt, table salt, and sea salt. Worcestershire sauce. Tartar sauce. Barbecue sauce. Teriyaki sauce. Soy sauce, including reduced-sodium. Steak sauce. Canned and packaged gravies. Fish sauce. Oyster sauce. Cocktail sauce. Store-bought horseradish. Ketchup. Mustard. Meat flavorings and tenderizers. Bouillon cubes. Hot sauces. Pre-made or packaged marinades. Pre-made or packaged taco seasonings. Relishes. Regular salad dressings. Other foods Salted popcorn and pretzels. The items listed above may not be a complete list of foods and beverages you should avoid. Contact a dietitian for more information. Where to find more information National Heart, Lung, and Blood Institute: PopSteam.is American Heart Association: www.heart.org Academy of Nutrition and Dietetics: www.eatright.org National Kidney Foundation: www.kidney.org Summary The DASH eating plan is a healthy eating plan that has been shown to reduce high blood pressure (hypertension). It may also reduce your risk for type 2 diabetes, heart disease, and stroke. When on the DASH eating plan, aim to eat more fresh fruits and vegetables, whole grains, lean proteins, low-fat dairy, and heart-healthy fats. With the DASH eating plan, you should limit salt (sodium) intake to 2,300 mg a day. If you have hypertension, you may need to reduce your sodium intake to 1,500 mg a day. Work with your health care provider or dietitian to adjust your eating plan to your individual calorie needs. This information is not intended to replace advice given to you by your health care provider. Make sure you discuss any questions you have with your health care  provider. Document Revised: 10/09/2019 Document Reviewed: 10/09/2019 Elsevier Patient Education  2023 [employer].

## 2022-06-26 NOTE — Progress Notes (Signed)
Established Patient Office Visit  Subjective   Patient ID: Cynthia Hodge, female    DOB: 08-31-1976  Age: 46 y.o. MRN: [id number]  Chief Complaint  Patient presents with   Follow-up   Hypertension    Patient brought in her BP monitor to have checked for accuracy.    HPI  Cynthia Hodge  is a 46 y/o female who has history of hypertension, and presents for routine follow up visit. She brought her blood pressure machine and her readings were between 135/95 to 140's/90. During visit, her blood pressure was 174/100 and it was 112/67 when rechecked. She states that she's compliant with her medications, denies side effects and continues to make healthy lifestyle changes. She also states that she's is usually anxious at [employer]. She states that she's been having anxiety attacks with taking Aripiprazole 2.5 mg, though she denies suicidal nor homicidal ideation.Her LDL was 138 mg/dl, total cholesterol 209 mg/dl and triglycerides 180 mg/dl, HDL 38 mg/dl. HgbA1c was 5.8% .Overall, she states that she's doing well and offers no further complaint.  Review of Systems  Constitutional: Negative.   Eyes: Negative.   Respiratory: Negative.    Cardiovascular: Negative.   Skin: Negative.   Neurological: Negative.   Psychiatric/Behavioral: Negative.        Objective:     BP 112/67 (BP Location: Right Arm, Patient Position: Sitting, Cuff Size: Large)   Pulse 83   Temp 98.4 F (36.9 C) (Oral)   Resp 16   Ht 5' 4.5" (1.638 m)   Wt 227 lb 11.2 oz (103.3 kg)   LMP 06/05/2022 (Approximate)   SpO2 97%   BMI 38.48 kg/m  BP Readings from Last 3 Encounters:  06/26/22 112/67  05/15/22 (!) 164/84  02/06/22 (!) 167/91   Wt Readings from Last 3 Encounters:  06/26/22 227 lb 11.2 oz (103.3 kg)  05/15/22 222 lb 1.6 oz (100.7 kg)  02/06/22 206 lb 11.2 oz (93.8 kg)    Encouraged weight loss  Physical Exam HENT:     Head: Normocephalic and atraumatic.     Mouth/Throat:     Mouth:  Mucous membranes are moist.  Eyes:     Extraocular Movements: Extraocular movements intact.     Conjunctiva/sclera: Conjunctivae normal.     Pupils: Pupils are equal, round, and reactive to light.  Cardiovascular:     Rate and Rhythm: Normal rate and regular rhythm.     Pulses: Normal pulses.     Heart sounds: Normal heart sounds.  Pulmonary:     Effort: Pulmonary effort is normal.     Breath sounds: Normal breath sounds.  Skin:    General: Skin is warm.  Neurological:     General: No focal deficit present.     Mental Status: She is alert and oriented to person, place, and time. Mental status is at baseline.  Psychiatric:        Mood and Affect: Mood normal.        Behavior: Behavior normal.        Thought Content: Thought content normal.        Judgment: Judgment normal.      No results found for any visits on 06/26/22.  Last CBC Lab Results  Component Value Date   WBC 11.0 (H) 05/15/2022   HGB 14.4 05/15/2022   HCT 44.8 05/15/2022   MCV 87 05/15/2022   MCH 28.0 05/15/2022   RDW 13.9 05/15/2022   PLT 322 97/58/8325   Last metabolic  panel Lab Results  Component Value Date   GLUCOSE 111 (H) 05/15/2022   NA 140 05/15/2022   K 4.9 05/15/2022   CL 105 05/15/2022   CO2 21 05/15/2022   BUN 15 05/15/2022   CREATININE 0.84 05/15/2022   EGFR 86 05/15/2022   CALCIUM 9.4 05/15/2022   PROT 7.1 05/15/2022   ALBUMIN 4.1 05/15/2022   LABGLOB 3.0 05/15/2022   AGRATIO 1.4 05/15/2022   BILITOT 0.9 05/15/2022   ALKPHOS 76 05/15/2022   AST 20 05/15/2022   ALT 22 05/15/2022   ANIONGAP 9 10/26/2021   Last lipids Lab Results  Component Value Date   CHOL 209 (H) 05/15/2022   HDL 38 (L) 05/15/2022   LDLCALC 138 (H) 05/15/2022   TRIG 180 (H) 05/15/2022   CHOLHDL 5.5 (H) 05/15/2022   Last hemoglobin A1c Lab Results  Component Value Date   HGBA1C 5.8 (H) 05/15/2022      The 10-year ASCVD risk score (Arnett DK, et al., 2019) is: 1.9%    Assessment & Plan:   1.  Essential hypertension - Her blood pressure is improving, her goal should be less than 130/80. She will continue on current medication, DASH diet and exercise as tolerated. She was educated on signs and symptoms of Stroke and advised to go to the ED. - metoprolol tartrate (LOPRESSOR) 50 MG tablet; Take 1 tablet (50 mg total) by mouth 2 (two) times daily.  Dispense: 60 tablet; Refill: 2 - lisinopril (ZESTRIL) 20 MG tablet; Take 1 tablet (20 mg total) by mouth once daily.  Dispense: 30 tablet; Refill: 2  2. Generalized anxiety disorder - She will continue on current medication and will follow up with [facility] Behavioral health team. She was advised to call the crisis help line with worsening symptoms.   3. Prediabetes -Her HgbA1c was 5.8%, she was advised to continue on low carb/non concentrated sweet diet and exercise as tolerated.  4. Elevated lipids - Her ASCVD was 1.9%, she was advised to continue on low fat/cholesterol diet and exercise as tolerated.    Return in about 3 months (around 09/26/2022), or if symptoms worsen or fail to improve.    Cynthia Hodge Cynthia Coombe, NP

## 2022-07-03 ENCOUNTER — Other Ambulatory Visit: Payer: Self-pay

## 2022-07-04 ENCOUNTER — Telehealth: Payer: Self-pay

## 2022-07-04 NOTE — Telephone Encounter (Signed)
Called pt to schedule with Cameren Earnest. Left vmail.

## 2022-07-17 ENCOUNTER — Ambulatory Visit: Payer: Self-pay | Admitting: Licensed Clinical Social Worker

## 2022-07-18 ENCOUNTER — Ambulatory Visit: Payer: Self-pay | Admitting: Licensed Clinical Social Worker

## 2022-07-18 ENCOUNTER — Telehealth: Payer: Self-pay | Admitting: Gerontology

## 2022-07-18 DIAGNOSIS — F411 Generalized anxiety disorder: Secondary | ICD-10-CM

## 2022-07-18 DIAGNOSIS — F339 Major depressive disorder, recurrent, unspecified: Secondary | ICD-10-CM

## 2022-07-18 NOTE — BH Specialist Note (Signed)
Integrated Behavioral Health Follow Up Telephone Visit  MRN: [id number] Name: Cynthia Hodge  Number of [place] Clinician visits: No data recorded Session Start time: No data recorded  Session End time: No data recorded Total time in minutes: No data recorded  Types of Service: Telephone visit  Interpretor:No. Interpretor Name and Language: N/A  Subjective: Cynthia Hodge is a 46 y.o. female accompanied by  herself  Patient was referred by Carlyon Shadow, NP for mental . Patient reports the following symptoms/concerns: The patient reports that she has been doing okay since her last follow up appointment. Eleanore reported that she stopped taking Abilify because it was making her an emotional zombie. She stated that other people also noticed that the medication was making her act weird and told her she should stop taking it. She shared she was hungry all the time and wanted to eat large amounts of food, but was able to use will power to control the urges to overeat.  Kenzleigh stated that she avoided taking any medication including Tylenol.  She stated she did not feel like this medication did not help her to not feel afraid. The patient denied any suicidal or homicidal thoughts.  Duration of problem: Years; Severity of problem: moderate  Objective: Mood: Euthymic and Affect: Appropriate Risk of harm to self or others: No plan to harm self or others  Life Context: Family and Social: see above School/Work: see above Self-Care: see above Life Changes: see above   Patient and/or Family's Strengths/Protective Factors: Concrete supports in place (healthy food, safe environments, etc.) and Sense of purpose  Goals Addressed: Patient will:  Reduce symptoms of: agitation, anxiety, depression, and stress   Increase knowledge and/or ability of: coping skills, healthy habits, self-management skills, and stress reduction   Demonstrate ability to: Increase healthy  adjustment to current life circumstances  Progress towards Goals: Ongoing  Interventions: Interventions utilized:  CBT Cognitive Behavioral Therapywas utilized by the clinician during today's follow up session. Clinician met with patient to identify needs related to stressors and functioning, and assess and monitor for signs and symptoms of anxiety and depression, and assess safety.  Clinician measured the patient's anxiety and depression on a numerical scale the clinician processed with the patient how they have been doing since the last follow-up session.  Clinician explored the patient's fears and concerns about medication, identified negative thought patterns, and collaboratively challenged and reframed those thoughts with the patient.  Clinician determined that gradual exposure to information about medication benefits may be helpful. Clinician explained that medication is an option, not a requirement; advised patient can always revisit medication later if desired, even if as a short-term consideration.  The session ended with scheduling. Standardized Assessments completed: GAD-7 and PHQ 9 GAD-7=17 PHQ-9=13  Assessment: Patient currently experiencing see above.   Patient may benefit from see above.  Plan: Follow up with behavioral health clinician on : 07/25/2022 at 11:00 am  Behavioral recommendations:  Referral(s): [place] (In Clinic) "From scale of 1-10, how likely are you to follow plan?":   Lesli Albee, LCSWA

## 2022-07-18 NOTE — Telephone Encounter (Signed)
Called pt to reschedule appt from 5pm to 4 pm pn 07/18/22. Left vmail.

## 2022-07-24 ENCOUNTER — Other Ambulatory Visit: Payer: Self-pay

## 2022-07-26 ENCOUNTER — Telehealth: Payer: Self-pay | Admitting: Gerontology

## 2022-07-26 NOTE — Telephone Encounter (Signed)
Called pt to reschedule appt on 11/07. Pt did not pick up, vm left.  Rescheduled appointment for 11/16 at 3:00 PM. Stated this in voicemail, told her to call back if she cannot make that date/time.

## 2022-07-29 ENCOUNTER — Other Ambulatory Visit: Payer: Self-pay

## 2022-07-30 ENCOUNTER — Other Ambulatory Visit: Payer: Self-pay

## 2022-08-02 ENCOUNTER — Ambulatory Visit: Payer: Self-pay | Admitting: Licensed Clinical Social Worker

## 2022-08-09 ENCOUNTER — Encounter: Payer: Self-pay | Admitting: Licensed Clinical Social Worker

## 2022-08-09 ENCOUNTER — Ambulatory Visit: Payer: Self-pay | Admitting: Licensed Clinical Social Worker

## 2022-08-09 DIAGNOSIS — F339 Major depressive disorder, recurrent, unspecified: Secondary | ICD-10-CM

## 2022-08-09 DIAGNOSIS — F411 Generalized anxiety disorder: Secondary | ICD-10-CM
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
## 2022-08-14 ENCOUNTER — Telehealth: Payer: Self-pay | Admitting: Licensed Clinical Social Worker

## 2022-08-14 ENCOUNTER — Ambulatory Visit: Payer: Self-pay | Admitting: Licensed Clinical Social Worker

## 2022-08-14 DIAGNOSIS — F411 Generalized anxiety disorder: Secondary | ICD-10-CM

## 2022-08-14 DIAGNOSIS — F339 Major depressive disorder, recurrent, unspecified: Secondary | ICD-10-CM

## 2022-08-14 NOTE — BH Specialist Note (Signed)
Integrated Behavioral Health via Telemedicine Visit  08/14/2022 HANNE KEGG [id number]  Number of [place] Clinician visits: No data recorded Session Start time: No data recorded  Session End time: No data recorded Total time in minutes: No data recorded  Referring Provider: Carlyon Shadow, NP Patient/Family location: The Patient's Address Cynthia Hodge Provider location: [facility] [place] All persons participating in visit: Ardine Iacovelli. [place] and [employer], LCSW-A Types of Service: Telephone visit  I connected with [name] via  Telephone or [employer]  (Video is Caregility application) and verified that I am speaking with the correct person using two identifiers. Discussed confidentiality: Yes   I discussed the limitations of telemedicine and the availability of in person reports that she has been on appointments.  Discussed there is a possibility of technology failure and discussed alternative modes of communication if that failure occurs.  Patient and/or legal guardian expressed understanding and consented to Telemedicine visit: Yes   Presenting Concerns: Patient and/or family reports the following symptoms/concerns: The patient reports that she has been doing well since her last follow-up session.  The patient discussed making progress towards settling her late mother's estate.  The patient shared that she continues to walk a few times a week and is trying not to overdo it.  The patient discussed her relationship with her sister and noted that she felt like her sister was always treated better than her by her family members.  The patient noted that since her mother passed away she has been staying with her father because she cannot live alone.  The patient noted when she is alone for more than half a day she becomes anxious and fearful.  The patient noted that her uncles are the same way and that this is common in  her family.  The patient noted she did not have any difficulty going to a store by herself or out in the community as long as she was around other people.  Trinitey noted she has been struggling with being alone since her childhood.  The patient shared her family history of schizophrenia and stated that she that knew she did not have schizophrenia but rather she had anxiety like her uncles.  The patient denied any suicidal or homicidal thoughts. duration of problem: Years; Severity of problem: moderate  Patient and/or Family's Strengths/Protective Factors: Concrete supports in place (healthy food, safe environments, etc.), Sense of purpose, and Physical Health (exercise, healthy diet, medication compliance, etc.)  Goals Addressed: Patient will:  Reduce symptoms of: agitation, anxiety, depression, insomnia, and stress   Increase knowledge and/or ability of: coping skills, healthy habits, self-management skills, and stress reduction   Demonstrate ability to: Increase healthy adjustment to current life circumstances and Increase adequate support systems for patient/family  Progress towards Goals: Ongoing  Interventions: Interventions utilized:  CBT Cognitive Behavioral Therapywas utilized by the clinician during today's follow up session. Clinician met with patient to identify needs related to stressors and functioning, and assess and monitor for signs and symptoms of anxiety and depression, and assess safety. The clinician processed with the patient how they have been doing since the last follow-up session. Clinician explored the patient's thoughts and feelings associated with the patient's family mental health history. Clinician used Psychoeducation to enhance the patient's awareness and understanding and encourage self reflection. The session ended with scheduling. Standardized Assessments completed: Not Needed   Assessment: Patient currently experiencing see above.   Patient may benefit from  see  above.  Plan: Follow up with behavioral health clinician on : 08/22/2023 @ 4:00 PM Behavioral recommendations:   Referral(s): [place] (In Clinic)  I discussed the assessment and treatment plan with the patient and/or parent/guardian. They were provided an opportunity to ask questions and all were answered. They agreed with the plan and demonstrated an understanding of the instructions.   They were advised to call back or seek an in-person evaluation if the symptoms worsen or if the condition fails to improve as anticipated.  Lesli Albee, LCSWA

## 2022-08-16 NOTE — Telephone Encounter (Signed)
Called the patient  during today's scheduled appointment; no answer, left a voicemail with the clinic contact information

## 2022-08-21 ENCOUNTER — Ambulatory Visit: Payer: Self-pay | Admitting: Licensed Clinical Social Worker

## 2022-08-23 ENCOUNTER — Ambulatory Visit: Payer: Self-pay | Admitting: Gerontology

## 2022-08-23 ENCOUNTER — Ambulatory Visit: Payer: Self-pay | Admitting: Licensed Clinical Social Worker

## 2022-08-23 DIAGNOSIS — F339 Major depressive disorder, recurrent, unspecified: Secondary | ICD-10-CM

## 2022-08-23 DIAGNOSIS — F411 Generalized anxiety disorder: Secondary | ICD-10-CM

## 2022-08-23 NOTE — BH Specialist Note (Addendum)
Session Canceled

## 2022-08-24 ENCOUNTER — Other Ambulatory Visit: Payer: Self-pay

## 2022-08-29 ENCOUNTER — Telehealth: Payer: Self-pay | Admitting: Gerontology

## 2022-08-29 NOTE — Telephone Encounter (Signed)
1st attempt at calling pt to schedule appt with [facility]. Left msg.

## 2022-09-03 ENCOUNTER — Other Ambulatory Visit: Payer: Self-pay

## 2022-09-05 ENCOUNTER — Ambulatory Visit: Payer: Self-pay | Admitting: Licensed Clinical Social Worker

## 2022-09-12 ENCOUNTER — Ambulatory Visit: Payer: Self-pay | Admitting: Licensed Clinical Social Worker

## 2022-09-12 DIAGNOSIS — F411 Generalized anxiety disorder: Secondary | ICD-10-CM

## 2022-09-12 DIAGNOSIS — F339 Major depressive disorder, recurrent, unspecified: Secondary | ICD-10-CM
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
## 2022-09-24 ENCOUNTER — Other Ambulatory Visit: Payer: Self-pay

## 2022-09-25 ENCOUNTER — Other Ambulatory Visit: Payer: Self-pay | Admitting: Gerontology

## 2022-09-25 ENCOUNTER — Ambulatory Visit: Payer: Self-pay | Admitting: Gerontology

## 2022-09-25 ENCOUNTER — Ambulatory Visit: Payer: Self-pay | Admitting: Licensed Clinical Social Worker

## 2022-09-25 DIAGNOSIS — R768 Other specified abnormal immunological findings in serum: Secondary | ICD-10-CM

## 2022-09-25 DIAGNOSIS — E785 Hyperlipidemia, unspecified: Secondary | ICD-10-CM

## 2022-09-25 DIAGNOSIS — F339 Major depressive disorder, recurrent, unspecified: Secondary | ICD-10-CM

## 2022-09-25 DIAGNOSIS — F411 Generalized anxiety disorder: Secondary | ICD-10-CM

## 2022-09-25 NOTE — BH Specialist Note (Signed)
Integrated Behavioral Health via Telemedicine Visit  09/25/2022 Cynthia Hodge [id number]  Number of [place] Clinician visits: No data recorded Session Start time: No data recorded  Session End time: No data recorded Total time in minutes: No data recorded  Referring Provider: Carlyon Shadow, NP  Patient/Family location: The patient's home address [facility] Provider location: [facility] All persons participating in visit: Minh Jasper. [employer], LCSW-A Types of Service: Telephone visit  I connected with [name] via  Telephone or [employer]  (Video is Caregility application) and verified that I am speaking with the correct person using two identifiers. Discussed confidentiality: Yes   I discussed the limitations of telemedicine and the availability of in person appointments.  Discussed there is a possibility of technology failure and discussed alternative modes of communication if that failure occurs.  Patient and/or legal guardian expressed understanding and consented to Telemedicine visit: Yes   Presenting Concerns: Patient and/or family reports the following symptoms/concerns: The patient reports that she has been doing well since her last follow-up visit.  Rida described not exactly dreading the holidays but not looking forward to them either. She stated that she looks forward to enjoying good food over Thanksgiving but sometimes being around her sister can be difficult.  The patient shared that she has been spending a lot of time watching TV and sitting in the recliner trying to take it easy. Lindsee noted that she has never like to take medication even over-the-counter medicines.  She stated that she is aware that anything you take comes with side effects that can be harmful. The patient noted she will take a Tylenol if she has to for headache but that is on a very rare occasion. The patient  explained that when she tried Abilify it made her very hungry and she had urges to eat large amounts of food however, she noted she was able to resist those urges and not overeat. She stated that her body was sensitive to mediations and she did not like to take anything that would make her feel funny. The patient denied any suicidal or homicidal thoughts.  Duration of problem: Years ; Severity of problem: moderate  Patient and/or Family's Strengths/Protective Factors: Concrete supports in place (healthy food, safe environments, etc.), Sense of purpose, and Physical Health (exercise, healthy diet, medication compliance, etc.)  Goals Addressed: Patient will:  Reduce symptoms of: agitation, anxiety, depression, and stress   Increase knowledge and/or ability of: coping skills, healthy habits, self-management skills, and stress reduction   Demonstrate ability to: Increase healthy adjustment to current life circumstances  Progress towards Goals: Ongoing  Interventions: Interventions utilized:  Motivational Interviewing and CBT Cognitive Behavioral Therapywas utilized by the clinician during today's follow up session. Clinician met with patient to identify needs related to stressors and functioning, and assess and monitor for signs and symptoms of anxiety and depression, and assess safety. The clinician processed with the patient how they have been doing since the last follow-up session.  Clinician acknowledged and explored the patient and ambivalence toward medication and validated the patient's mixed feelings by discussing the perceived pros and cons of taking psychotropic medication and utilized motivational interviewing techniques to understand the underlying reasons behind the reluctance. Clinician encouraged an open dialogue about fears and expectations, emphasizing the potential benefits of psychotropic medication while respecting the patient's autonomy. Collaboratively the clinician and patient  worked to develop a balanced perspective and address concerns to help the  patient make an informed decision about psychotropic medication.Clinician informed the patient that medication is an option, not a requirement; advised patient can always revisit medication later if desired, even if as a short-term consideration. The session ended with scheduling.  Standardized Assessments completed:  Due to time constraints will complete at follow-up   Assessment: Patient currently experiencing see above.   Patient may benefit from see above.  Plan: Follow up with behavioral health clinician on : 10/04/2022 @ 2:30 Behavioral recommendations:  Referral(s): [facility] [employer] [facility] (In Clinic)  I discussed the assessment and treatment plan with the patient and/or parent/guardian. They were provided an opportunity to ask questions and all were answered. They agreed with the plan and demonstrated an understanding of the instructions.   They were advised to call back or seek an in-person evaluation if the symptoms worsen or if the condition fails to improve as anticipated.  Judith Part, LCSWA

## 2022-10-03 ENCOUNTER — Other Ambulatory Visit: Payer: Self-pay | Admitting: Gerontology

## 2022-10-03 ENCOUNTER — Other Ambulatory Visit: Payer: Self-pay

## 2022-10-03 DIAGNOSIS — I10 Essential (primary) hypertension: Secondary | ICD-10-CM

## 2022-10-03 MED FILL — Lisinopril Tab 20 MG: ORAL | 30 days supply | Qty: 30 | Fill #0 | Status: AC

## 2022-10-04 ENCOUNTER — Ambulatory Visit: Payer: Self-pay | Admitting: Gerontology

## 2022-10-04 ENCOUNTER — Other Ambulatory Visit: Payer: Self-pay

## 2022-10-04 ENCOUNTER — Encounter: Payer: Self-pay | Admitting: Licensed Clinical Social Worker

## 2022-10-16 ENCOUNTER — Other Ambulatory Visit: Payer: Self-pay

## 2022-10-16 ENCOUNTER — Ambulatory Visit: Payer: Self-pay | Admitting: Gerontology

## 2022-10-16 ENCOUNTER — Encounter: Payer: Self-pay | Admitting: Gerontology

## 2022-10-16 ENCOUNTER — Ambulatory Visit: Payer: Self-pay | Admitting: Licensed Clinical Social Worker

## 2022-10-16 VITALS — BP 158/115 | HR 75 | Temp 97.6°F | Resp 16 | Ht 64.5 in | Wt 238.1 lb

## 2022-10-16 DIAGNOSIS — I10 Essential (primary) hypertension: Secondary | ICD-10-CM

## 2022-10-16 DIAGNOSIS — F411 Generalized anxiety disorder: Secondary | ICD-10-CM

## 2022-10-16 DIAGNOSIS — R0981 Nasal congestion: Secondary | ICD-10-CM

## 2022-10-16 DIAGNOSIS — F339 Major depressive disorder, recurrent, unspecified: Secondary | ICD-10-CM

## 2022-10-16 DIAGNOSIS — J029 Acute pharyngitis, unspecified: Secondary | ICD-10-CM

## 2022-10-16 MED ORDER — METOPROLOL TARTRATE 50 MG PO TABS
50.0000 mg | ORAL_TABLET | Freq: Two times a day (BID) | ORAL | 2 refills | Status: DC
  Filled 2022-10-16: qty 60, 30d supply, fill #0
  Filled 2022-11-21: qty 60, 30d supply, fill #1
  Filled 2022-12-18 (×2): qty 60, 30d supply, fill #2

## 2022-10-16 MED ORDER — SALINE SPRAY 0.65 % NA SOLN
1.0000 | NASAL | 0 refills | Status: DC | PRN
Start: 2022-10-16 — End: 2023-03-15
  Filled 2022-10-16: qty 30, fill #0

## 2022-10-16 NOTE — Progress Notes (Signed)
Established Patient Office Visit  Subjective   Patient ID: Cynthia Hodge, female    DOB: Jun 29, 1976  Age: 46 y.o. MRN: [id number]  Chief Complaint  Patient presents with   Follow-up   Hypertension    HPI  Cynthia Hodge  is a 46 y/o female who has history of hypertension, and presents for routine follow up visit , She states that she is getting over non productive coughing, sinus pressure, sore throat, rhinorrhea , sneezing for the past 3 weeks. She states that she has been taking sudafed for 3 weeks. She denies fever, chills and has not tested for Covid. Her blood pressure was elevated during visit, states that she's compliant with her medication, denies side effects and has not checked her blood pressure for 3 weeks. She denies chest pain, palpitation, dizziness and vision changes. She states that her mood is good, denies suicidal nor homicidal ideation and offers no furhter complaint.  Review of Systems  Constitutional: Negative.   HENT:  Positive for sinus pain and sore throat.   Respiratory: Negative.    Cardiovascular: Negative.   Neurological: Negative.   Psychiatric/Behavioral: Negative.        Objective:     BP (!) 158/115 (BP Location: Left Arm, Patient Position: Sitting, Cuff Size: Large) Comment: manually - patient taking Sudafed x 3 weeks  Pulse 75   Temp 97.6 F (36.4 C) (Oral)   Resp 16   Ht 5' 4.5" (1.638 m)   Wt 238 lb 1.6 oz (108 kg)   LMP 10/07/2022 (Approximate)   SpO2 96%   BMI 40.24 kg/m  BP Readings from Last 3 Encounters:  10/16/22 (!) 158/115  06/26/22 112/67  05/15/22 (!) 164/84   Wt Readings from Last 3 Encounters:  10/16/22 238 lb 1.6 oz (108 kg)  06/26/22 227 lb 11.2 oz (103.3 kg)  05/15/22 222 lb 1.6 oz (100.7 kg)      Physical Exam HENT:     Head: Normocephalic and atraumatic.     Mouth/Throat:     Mouth: Mucous membranes are moist.  Eyes:     Extraocular Movements: Extraocular movements intact.     Conjunctiva/sclera:  Conjunctivae normal.     Pupils: Pupils are equal, round, and reactive to light.  Cardiovascular:     Rate and Rhythm: Normal rate and regular rhythm.     Pulses: Normal pulses.     Heart sounds: Normal heart sounds.  Pulmonary:     Effort: Pulmonary effort is normal.     Breath sounds: Normal breath sounds.  Neurological:     General: No focal deficit present.     Mental Status: She is alert and oriented to person, place, and time. Mental status is at baseline.  Psychiatric:        Mood and Affect: Mood normal.        Behavior: Behavior normal.        Thought Content: Thought content normal.        Judgment: Judgment normal.      No results found for any visits on 10/16/22.  Last CBC Lab Results  Component Value Date   WBC 11.0 (H) 05/15/2022   HGB 14.4 05/15/2022   HCT 44.8 05/15/2022   MCV 87 05/15/2022   MCH 28.0 05/15/2022   RDW 13.9 05/15/2022   PLT 322 54/65/0354   Last metabolic panel Lab Results  Component Value Date   GLUCOSE 111 (H) 05/15/2022   NA 140 05/15/2022   K 4.9  05/15/2022   CL 105 05/15/2022   CO2 21 05/15/2022   BUN 15 05/15/2022   CREATININE 0.84 05/15/2022   EGFR 86 05/15/2022   CALCIUM 9.4 05/15/2022   PROT 7.1 05/15/2022   ALBUMIN 4.1 05/15/2022   LABGLOB 3.0 05/15/2022   AGRATIO 1.4 05/15/2022   BILITOT 0.9 05/15/2022   ALKPHOS 76 05/15/2022   AST 20 05/15/2022   ALT 22 05/15/2022   ANIONGAP 9 10/26/2021   Last lipids Lab Results  Component Value Date   CHOL 209 (H) 05/15/2022   HDL 38 (L) 05/15/2022   LDLCALC 138 (H) 05/15/2022   TRIG 180 (H) 05/15/2022   CHOLHDL 5.5 (H) 05/15/2022   Last hemoglobin A1c Lab Results  Component Value Date   HGBA1C 5.8 (H) 05/15/2022   Last thyroid functions Lab Results  Component Value Date   TSH 1.498 10/23/2021   Last vitamin D No results found for: "25OHVITD2", "25OHVITD3", "VD25OH"    The 10-year ASCVD risk score (Arnett DK, et al., 2019) is: 3.8%    Assessment & Plan:    1. Essential hypertension - Her blood pressure is not under control, she was advised to stop taking Sudafed, continue on blood pressure medication, check, record and bring blood pressure log to her next appointment. She was advised to continue on DASH diet and exercise as tolerated. She was educated on the signs and symptoms of Stroke and advised to go to the ED. - metoprolol tartrate (LOPRESSOR) 50 MG tablet; Take 1 tablet (50 mg total) by mouth 2 (two) times daily.  Dispense: 60 tablet; Refill: 2  2. Sore throat - She was advised to perform home Covid test, increase fluid intake.  3. Nasal congestion - She was started on Nasal saline spray daily, was advised to notify clinic for worsening symptoms. - sodium chloride (OCEAN) 0.65 % SOLN nasal spray; Place 1 spray into both nostrils as needed for congestion.  Dispense: 30 mL; Refill: 0   Return in about 15 days (around 10/31/2022), or if symptoms worsen or fail to improve.    Pranit Owensby Jerold Coombe, NP

## 2022-10-16 NOTE — BH Specialist Note (Signed)
Integrated Behavioral Health Follow Up In-Person Visit  MRN: [id number] Name: Cynthia Hodge  Number of [facility] Clinician visits: No data recorded Session Start time: No data recorded  Session End time: No data recorded Total time in minutes: No data recorded  Types of Service: Individual psychotherapy  Interpretor:No. Interpretor Name and Language: N/A  Subjective: Cynthia Hodge is a 46 y.o. female accompanied by  herself Patient was referred by Carlyon Shadow, NP for mental health. Patient reports the following symptoms/concerns: Patient and/or family reports the following symptoms/concerns: The patient reported that she has been doing about the same since her last follow-up appointment. Robie shared that she started walking about a half a mile a day, and explained she has to take her time and try not to over do it. She noted that she usually takes a couple of days off from walking to help her body heal. The patient discussed situational stressors impacting her life currently.  She stated that she finds watching TV to be helpful in taking her mind off of her problems. The patient discussed her relationship with her father and noted there has been a lot of improvement in the last year. The patient discussed not feeling well today. The patient denied any suicidal or homicidal thoughts. Duration of problem: Years; Severity of problem: moderate  Objective: Mood: Euthymic and Affect: Appropriate Risk of harm to self or others: No plan to harm self or others  Life Context: Family and Social: see above School/Work: see above Self-Care: see above Life Changes: see above  Patient and/or Family's Strengths/Protective Factors: Concrete supports in place (healthy food, safe environments, etc.), Sense of purpose, and Physical Health (exercise, healthy diet, medication compliance, etc.)  Goals Addressed: Patient will:  Reduce symptoms of: agitation, anxiety,  depression, and stress   Increase knowledge and/or ability of: coping skills, healthy habits, self-management skills, and stress reduction   Demonstrate ability to: Increase healthy adjustment to current life circumstances  Progress towards Goals: Ongoing  Interventions: Interventions utilized:  CBT Cognitive Behavioral Therapy  was utilized by the clinician during today's follow up session. Clinician met with patient to identify needs related to stressors and functioning, and assess and monitor for signs and symptoms of anxiety and depression, and assess safety. The clinician processed with the patient how they have been doing since the last follow-up session. Clinician continued to teach the patient calming relaxation and mindfulness skills and how to discriminate better between relaxation and tension and taught the client how to apply the skills in their daily life. Clinician assigned the patient homework in which they will use mindfulness skills daily gradually applying them progressively from not anxiety provoking to anxiety provoking situations and worked with the patient to resolve obstacles toward sustained implementation.  The session ended with scheduling.  Standardized Assessments completed: GAD-7 and PHQ 9 GAD-7=07 PHQ-9=15  Assessment: Patient currently experiencing see above.   Patient may benefit from see above.  Plan: Follow up with behavioral health clinician on : 10/23/2022 at 2:00 PM.  Behavioral recommendations:  Referral(s): [facility] (In Clinic) "From scale of 1-10, how likely are you to follow plan?":   Lesli Albee, LCSWA

## 2022-10-16 NOTE — Patient Instructions (Signed)
Sore Throat When you have a sore throat, your throat may feel: Tender. Burning. Irritated. Scratchy. Painful when you swallow. Painful when you talk. Many things can cause a sore throat, such as: An infection. Allergies. Dry air. Smoke or pollution. Radiation treatment for cancer. Gastroesophageal reflux disease (GERD). A tumor. A sore throat can be the first sign of another sickness. It can happen with other problems, like: Coughing. Sneezing. Fever. Swelling of the glands in the neck. Most sore throats go away without treatment. Follow these instructions at home:     Medicines Take over-the-counter and prescription medicines only as told by your doctor. Children often get sore throats. Do not give your child aspirin. Use throat sprays to soothe your throat as told by your health care provider. Managing pain To help with pain: Sip warm liquids, such as broth, herbal tea, or warm water. Eat or drink cold or frozen liquids, such as frozen ice pops. Rinse your mouth (gargle) with a salt water mixture 3-4 times a day or as needed. To make salt water, dissolve -1 tsp (3-6 g) of salt in 1 cup (237 mL) of warm water. Do not swallow this mixture. Suck on hard candy or throat lozenges. Put a cool-mist humidifier in your bedroom at night. Sit in the bathroom with the door closed for 5-10 minutes while you run hot water in the shower. General instructions Do not smoke or use any products that contain nicotine or tobacco. If you need help quitting, ask your doctor. Get plenty of rest. Drink enough fluid to keep your pee (urine) pale yellow. Wash your hands often for at least 20 seconds with soap and water. If soap and water are not available, use hand sanitizer. Contact a doctor if: You have a fever for more than 2-3 days. You keep having symptoms for more than 2-3 days. Your throat does not get better in 7 days. You have a fever and your symptoms suddenly get worse. Your  child who is 3 months to 3 years old has a temperature of 102.2F (39C) or higher. Get help right away if: You have trouble breathing. You cannot swallow fluids, soft foods, or your spit. You have swelling in your throat or neck that gets worse. You feel like you may vomit (nauseous) and this feeling lasts a long time. You cannot stop vomiting. These symptoms may be an emergency. Get help right away. Call your local emergency services (911 in the [place] Do not wait to see if the symptoms will go away. Do not drive yourself to the hospital. Summary A sore throat is a painful, burning, irritated, or scratchy throat. Many things can cause a sore throat. Take over-the-counter medicines only as told by your doctor. Get plenty of rest. Drink enough fluid to keep your pee (urine) pale yellow. Contact a doctor if your symptoms get worse or your sore throat does not get better within 7 days. This information is not intended to replace advice given to you by your health care provider. Make sure you discuss any questions you have with your health care provider. Document Revised: 02/01/2021 Document Reviewed: 02/01/2021 Elsevier Patient Education  2023 Else[employer] Inc[employer] DASH Eating Plan DASH stands for Dietary Approaches to Stop Hypertension. The DASH eating plan is a healthy eating plan that has been shown to: Reduce high blood pressure (hypertension). Reduce your risk for type 2 diabetes, heart disease, and stroke. Help with weight loss. What are tips for following this plan? Reading food labels Check food labels   for the amount of salt (sodium) per serving. Choose foods with less than 5 percent of the Daily Value of sodium. Generally, foods with less than 300 milligrams (mg) of sodium per serving fit into this eating plan. To find whole grains, look for the word "whole" as the first word in the ingredient list. Shopping Buy products labeled as "low-sodium" or "no salt added." Buy fresh foods. Avoid  canned foods and pre-made or frozen meals. Cooking Avoid adding salt when cooking. Use salt-free seasonings or herbs instead of table salt or sea salt. Check with your health care provider or pharmacist before using salt substitutes. Do not fry foods. Cook foods using healthy methods such as baking, boiling, grilling, roasting, and broiling instead. Cook with heart-healthy oils, such as olive, canola, avocado, soybean, or sunflower oil. Meal planning  Eat a balanced diet that includes: 4 or more servings of fruits and 4 or more servings of vegetables each day. Try to fill one-half of your plate with fruits and vegetables. 6-8 servings of whole grains each day. Less than 6 oz (170 g) of lean meat, poultry, or fish each day. A 3-oz (85-g) serving of meat is about the same size as a deck of cards. One egg equals 1 oz (28 g). 2-3 servings of low-fat dairy each day. One serving is 1 cup (237 mL). 1 serving of nuts, seeds, or beans 5 times each week. 2-3 servings of heart-healthy fats. Healthy fats called omega-3 fatty acids are found in foods such as walnuts, flaxseeds, fortified milks, and eggs. These fats are also found in cold-water fish, such as sardines, salmon, and mackerel. Limit how much you eat of: Canned or prepackaged foods. Food that is high in trans fat, such as some fried foods. Food that is high in saturated fat, such as fatty meat. Desserts and other sweets, sugary drinks, and other foods with added sugar. Full-fat dairy products. Do not salt foods before eating. Do not eat more than 4 egg yolks a week. Try to eat at least 2 vegetarian meals a week. Eat more home-cooked food and less restaurant, buffet, and fast food. Lifestyle When eating at a restaurant, ask that your food be prepared with less salt or no salt, if possible. If you drink alcohol: Limit how much you use to: 0-1 drink a day for women who are not pregnant. 0-2 drinks a day for men. Be aware of how much alcohol  is in your drink. In the [place] one drink equals one 12 oz bottle of beer (355 mL), one 5 oz glass of wine (148 mL), or one 1 oz glass of hard liquor (44 mL). General information Avoid eating more than 2,300 mg of salt a day. If you have hypertension, you may need to reduce your sodium intake to 1,500 mg a day. Work with your health care provider to maintain a healthy body weight or to lose weight. Ask what an ideal weight is for you. Get at least 30 minutes of exercise that causes your heart to beat faster (aerobic exercise) most days of the week. Activities may include walking, swimming, or biking. Work with your health care provider or dietitian to adjust your eating plan to your individual calorie needs. What foods should I eat? Fruits All fresh, dried, or frozen fruit. Canned fruit in natural juice (without added sugar). Vegetables Fresh or frozen vegetables (raw, steamed, roasted, or grilled). Low-sodium or reduced-sodium tomato and vegetable juice. Low-sodium or reduced-sodium tomato sauce and tomato paste. Low-sodium or reduced-sodium   canned vegetables. Grains Whole-grain or whole-wheat bread. Whole-grain or whole-wheat pasta. Brown rice. Oatmeal. Quinoa. Bulgur. Whole-grain and low-sodium cereals. Pita bread. Low-fat, low-sodium crackers. Whole-wheat flour tortillas. Meats and other proteins Skinless chicken or turkey. Ground chicken or turkey. Pork with fat trimmed off. Fish and seafood. Egg whites. Dried beans, peas, or lentils. Unsalted nuts, nut butters, and seeds. Unsalted canned beans. Lean cuts of beef with fat trimmed off. Low-sodium, lean precooked or cured meat, such as sausages or meat loaves. Dairy Low-fat (1%) or fat-free (skim) milk. Reduced-fat, low-fat, or fat-free cheeses. Nonfat, low-sodium ricotta or cottage cheese. Low-fat or nonfat yogurt. Low-fat, low-sodium cheese. Fats and oils Soft margarine without trans fats. Vegetable oil. Reduced-fat, low-fat, or light  mayonnaise and salad dressings (reduced-sodium). Canola, safflower, olive, avocado, soybean, and sunflower oils. Avocado. Seasonings and condiments Herbs. Spices. Seasoning mixes without salt. Other foods Unsalted popcorn and pretzels. Fat-free sweets. The items listed above may not be a complete list of foods and beverages you can eat. Contact a dietitian for more information. What foods should I avoid? Fruits Canned fruit in a light or heavy syrup. Fried fruit. Fruit in cream or butter sauce. Vegetables Creamed or fried vegetables. Vegetables in a cheese sauce. Regular canned vegetables (not low-sodium or reduced-sodium). Regular canned tomato sauce and paste (not low-sodium or reduced-sodium). Regular tomato and vegetable juice (not low-sodium or reduced-sodium). Pickles. Olives. Grains Baked goods made with fat, such as croissants, muffins, or some breads. Dry pasta or rice meal packs. Meats and other proteins Fatty cuts of meat. Ribs. Fried meat. Bacon. Bologna, salami, and other precooked or cured meats, such as sausages or meat loaves. Fat from the back of a pig (fatback). Bratwurst. Salted nuts and seeds. Canned beans with added salt. Canned or smoked fish. Whole eggs or egg yolks. Chicken or turkey with skin. Dairy Whole or 2% milk, cream, and half-and-half. Whole or full-fat cream cheese. Whole-fat or sweetened yogurt. Full-fat cheese. Nondairy creamers. Whipped toppings. Processed cheese and cheese spreads. Fats and oils Butter. Stick margarine. Lard. Shortening. Ghee. Bacon fat. Tropical oils, such as coconut, palm kernel, or palm oil. Seasonings and condiments Onion salt, garlic salt, seasoned salt, table salt, and sea salt. Worcestershire sauce. Tartar sauce. Barbecue sauce. Teriyaki sauce. Soy sauce, including reduced-sodium. Steak sauce. Canned and packaged gravies. Fish sauce. Oyster sauce. Cocktail sauce. Store-bought horseradish. Ketchup. Mustard. Meat flavorings and  tenderizers. Bouillon cubes. Hot sauces. Pre-made or packaged marinades. Pre-made or packaged taco seasonings. Relishes. Regular salad dressings. Other foods Salted popcorn and pretzels. The items listed above may not be a complete list of foods and beverages you should avoid. Contact a dietitian for more information. Where to find more information National Heart, Lung, and Blood Institute: www.nhlbi.nih.gov American Heart Association: www.heart.org Academy of Nutrition and Dietetics: www.eatright.org National Kidney Foundation: www.kidney.org Summary The DASH eating plan is a healthy eating plan that has been shown to reduce high blood pressure (hypertension). It may also reduce your risk for type 2 diabetes, heart disease, and stroke. When on the DASH eating plan, aim to eat more fresh fruits and vegetables, whole grains, lean proteins, low-fat dairy, and heart-healthy fats. With the DASH eating plan, you should limit salt (sodium) intake to 2,300 mg a day. If you have hypertension, you may need to reduce your sodium intake to 1,500 mg a day. Work with your health care provider or dietitian to adjust your eating plan to your individual calorie needs. This information is not intended to replace advice given   to you by your health care provider. Make sure you discuss any questions you have with your health care provider. Document Revised: 10/09/2019 Document Reviewed: 10/09/2019 Elsevier Patient Education  2023 Elsevier Inc.  

## 2022-10-17 ENCOUNTER — Other Ambulatory Visit: Payer: Self-pay

## 2022-10-17 DIAGNOSIS — E785 Hyperlipidemia, unspecified: Secondary | ICD-10-CM

## 2022-10-17 DIAGNOSIS — R768 Other specified abnormal immunological findings in serum: Secondary | ICD-10-CM

## 2022-10-20 LAB — LIPID PANEL
Chol/HDL Ratio: 6 ratio — ABNORMAL HIGH (ref 0.0–4.4)
Cholesterol, Total: 203 mg/dL — ABNORMAL HIGH (ref 100–199)
HDL: 34 mg/dL — ABNORMAL LOW (ref >39)
LDL Chol Calc (NIH): 137 mg/dL — ABNORMAL HIGH (ref 0–99)
Triglycerides: 179 mg/dL — ABNORMAL HIGH (ref 0–149)
VLDL Cholesterol Cal: 32 mg/dL (ref 5–40)

## 2022-10-20 LAB — HEAVY METALS PROFILE II, BLOOD
Arsenic: 2 ug/L (ref 0–9)
Cadmium: 0.7 ug/L (ref 0.0–1.2)
Lead, Blood: <1 ug/dL (ref 0.0–3.4)
Mercury: 1.2 ug/L (ref 0.0–14.9)

## 2022-10-20 LAB — ANTI-DNA ANTIBODY, DOUBLE-STRANDED: dsDNA Ab: 64 IU/mL — ABNORMAL HIGH (ref 0–9)

## 2022-10-20 LAB — ANA: Anti Nuclear Antibody (ANA): POSITIVE — AB

## 2022-10-23 ENCOUNTER — Ambulatory Visit: Payer: Self-pay | Admitting: Licensed Clinical Social Worker

## 2022-10-23 ENCOUNTER — Telehealth: Payer: Self-pay | Admitting: Licensed Clinical Social Worker

## 2022-10-23 NOTE — Telephone Encounter (Signed)
Called the patient to inform her that her [facility]BH follow up appointment was rescheduled to 12/12 at 4:00 PM; no answer left message.

## 2022-10-28 ENCOUNTER — Other Ambulatory Visit: Payer: Self-pay

## 2022-10-28 MED FILL — Lisinopril Tab 20 MG: ORAL | 30 days supply | Qty: 30 | Fill #1 | Status: AC

## 2022-10-29 ENCOUNTER — Other Ambulatory Visit: Payer: Self-pay

## 2022-10-30 ENCOUNTER — Ambulatory Visit: Payer: Self-pay | Admitting: Licensed Clinical Social Worker

## 2022-10-30 DIAGNOSIS — F411 Generalized anxiety disorder: Secondary | ICD-10-CM
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
## 2022-10-31 ENCOUNTER — Ambulatory Visit: Payer: Self-pay | Admitting: Gerontology

## 2022-10-31 ENCOUNTER — Encounter: Payer: Self-pay | Admitting: Gerontology

## 2022-10-31 ENCOUNTER — Other Ambulatory Visit: Payer: Self-pay

## 2022-10-31 VITALS — BP 163/101 | HR 68 | Temp 97.0°F | Wt 238.1 lb

## 2022-10-31 DIAGNOSIS — R768 Other specified abnormal immunological findings in serum: Secondary | ICD-10-CM

## 2022-10-31 DIAGNOSIS — I10 Essential (primary) hypertension: Secondary | ICD-10-CM

## 2022-10-31 MED ORDER — LISINOPRIL 20 MG PO TABS
20.0000 mg | ORAL_TABLET | Freq: Every day | ORAL | 2 refills | Status: DC
  Filled 2022-10-31 – 2022-11-21 (×2): qty 30, 30d supply, fill #0
  Filled 2022-12-21: qty 30, 30d supply, fill #1
  Filled 2023-01-16: qty 30, 30d supply, fill #2

## 2022-10-31 NOTE — Patient Instructions (Signed)

## 2022-10-31 NOTE — Progress Notes (Signed)
Established Patient Office Visit  Subjective   Patient ID: Cynthia Hodge, female    DOB: [birth year]  Age: 46 y.o. MRN: [id number]  No chief complaint on file.   HPI  Cynthia Hodge  is a 46 y/o female who has history of hypertension, and presents for routine follow up visit. She states that she is compliant with her medications and denies any side effects. Se states that she has been taking blood pressure and its been in the 160's/ 100's. She states that she experiences "white coat" syndrome during visits which causes her blood pressure to increase. She denies experiencing any vision changes, lightheadedness, and chest pain. She is making healthy lifestyle modifications. She states that she has decreased her soda intake to once a day with a meal. She states that she is also incorporating  walking into her daily routine. Her ANA lab done on 10/17/22 was positive and she will follow up with Rheumatologist Dr Jefm Bryant.  Overall, she states that she is doing well and offers no further complaints.   Review of Systems  Constitutional: Negative.   HENT: Negative.    Eyes: Negative.   Respiratory: Negative.    Cardiovascular: Negative.   Gastrointestinal: Negative.   Genitourinary: Negative.   Musculoskeletal: Negative.   Skin: Negative.   Neurological: Negative.   Endo/Heme/Allergies: Negative.   Psychiatric/Behavioral: Negative.        Objective:     BP (!) 163/101 (BP Location: Left Arm, Patient Position: Sitting, Cuff Size: Large)   Pulse 68   Temp (!) 97 F (36.1 C)   Wt 238 lb 1.6 oz (108 kg)   LMP 10/07/2022 (Approximate)   BMI 40.24 kg/m  BP Readings from Last 3 Encounters:  10/31/22 (!) 163/101  10/17/22 (!) 195/94  10/16/22 (!) 158/115   Wt Readings from Last 3 Encounters:  10/31/22 238 lb 1.6 oz (108 kg)  10/17/22 238 lb (108 kg)  10/16/22 238 lb 1.6 oz (108 kg)      Physical Exam Constitutional:      Appearance: Normal appearance. She is normal weight.   HENT:     Head: Normocephalic and atraumatic.  Eyes:     Conjunctiva/sclera: Conjunctivae normal.  Cardiovascular:     Rate and Rhythm: Normal rate and regular rhythm.     Pulses: Normal pulses.     Heart sounds: Normal heart sounds.  Pulmonary:     Effort: Pulmonary effort is normal.     Breath sounds: Normal breath sounds.  Skin:    General: Skin is warm and dry.  Neurological:     Mental Status: She is alert.  Psychiatric:        Mood and Affect: Mood normal.      No results found for any visits on 10/31/22.  Last CBC Lab Results  Component Value Date   WBC 11.0 (H) 05/15/2022   HGB 14.4 05/15/2022   HCT 44.8 05/15/2022   MCV 87 05/15/2022   MCH 28.0 05/15/2022   RDW 13.9 05/15/2022   PLT 322 48/11/6551   Last metabolic panel Lab Results  Component Value Date   GLUCOSE 111 (H) 05/15/2022   NA 140 05/15/2022   K 4.9 05/15/2022   CL 105 05/15/2022   CO2 21 05/15/2022   BUN 15 05/15/2022   CREATININE 0.84 05/15/2022   EGFR 86 05/15/2022   CALCIUM 9.4 05/15/2022   PROT 7.1 05/15/2022   ALBUMIN 4.1 05/15/2022   LABGLOB 3.0 05/15/2022   AGRATIO 1.4 05/15/2022  BILITOT 0.9 05/15/2022   ALKPHOS 76 05/15/2022   AST 20 05/15/2022   ALT 22 05/15/2022   ANIONGAP 9 10/26/2021   Last lipids Lab Results  Component Value Date   CHOL 203 (H) 10/17/2022   HDL 34 (L) 10/17/2022   LDLCALC 137 (H) 10/17/2022   TRIG 179 (H) 10/17/2022   CHOLHDL 6.0 (H) 10/17/2022   Last hemoglobin A1c Lab Results  Component Value Date   HGBA1C 5.8 (H) 05/15/2022   Last thyroid functions Lab Results  Component Value Date   TSH 1.498 10/23/2021   Last vitamin D No results found for: "25OHVITD2", "25OHVITD3", "VD25OH" Last vitamin B12 and Folate Lab Results  Component Value Date   VITAMINB12 314 10/25/2021   FOLATE 15.9 10/25/2021      The 10-year ASCVD risk score (Arnett DK, et al., 2019) is: 4.5%    Assessment & Plan:  1. ANA positive - She will follow up with  Dr. Jefm Bryant in Jan or Feb.   2. Essential hypertension - Her blood pressure is uncontrolled, probably due to white coat syndrome, stating that she's anxious going to doctors offices. She was advised to continue on blood pressure medication and to check BP daily or if symptoms presents. She was educated on the signs and symptoms of stroke and advised to go to the ED. She was encouraged to continue on DASH diet and exercise a tolerated.    F/U in month on or before 01/30/2023, or if symptoms worsen or fail to improve.   Sharon Mt, FNP student

## 2022-11-21 ENCOUNTER — Other Ambulatory Visit: Payer: Self-pay

## 2022-11-22 ENCOUNTER — Ambulatory Visit: Payer: Self-pay | Admitting: Licensed Clinical Social Worker

## 2022-11-22 ENCOUNTER — Other Ambulatory Visit: Payer: Self-pay

## 2022-11-22 DIAGNOSIS — F411 Generalized anxiety disorder: Secondary | ICD-10-CM

## 2022-11-22 DIAGNOSIS — F339 Major depressive disorder, recurrent, unspecified: Secondary | ICD-10-CM

## 2022-11-22 NOTE — BH Specialist Note (Addendum)
Integrated Behavioral Health via Telemedicine Visit  11/22/2022 Cynthia Hodge [id number]  Number of [place] Clinician visits: No data recorded Session Start time: No data recorded  Session End time: No data recorded Total time in minutes: No data recorded  Referring Provider: Carlyon Shadow, NP Patient/Family location: [facility] Provider location: [facility] [place] All persons participating in visit: Cynthia Hodge. Cynthia Hodge and [employer], LCSW Types of Service: Telephone visit  I connected with Cynthia Hodge via  Telephone or [employer]  (Video is Caregility application) and verified that I am speaking with the correct person using two identifiers. Discussed confidentiality: Yes   I discussed the limitations of telemedicine and the availability of in person appointments.  Discussed there is a possibility of technology failure and discussed alternative modes of communication if that failure occurs.  Patient and/or legal guardian expressed understanding and consented to Telemedicine visit: Yes   Presenting Concerns: Patient and/or family reports the following symptoms/concerns: The patient reports that she has been doing well since her last follow up appointment.  She shared that she enjoyed her holidays with her family.  She discussed that everything has been pretty quiet in her life.  She shared that she was able to finish getting her late mother's house cleaned out and ready to rent.  The patient noted that she believes she has been approved for Medicaid but is not sure.  Micaela noted that she has filed disability and is hopeful about her future.  The patient stated overall she believes she is doing better and moving towards her goals.  The patient denied any suicidal or homicidal thoughts. Duration of problem: Years; Severity of problem: moderate  Patient and/or Family's Strengths/Protective  Factors: Concrete supports in place (healthy food, safe environments, etc.), Sense of purpose, and Physical Health (exercise, healthy diet, medication compliance, etc.)  Goals Addressed: Patient will:  Reduce symptoms of: agitation, anxiety, depression, and stress   Increase knowledge and/or ability of: coping skills, healthy habits, self-management skills, and stress reduction   Demonstrate ability to: Increase healthy adjustment to current life circumstances  Progress towards Goals: Ongoing  Interventions: Interventions utilized:  CBT Cognitive Behavioral Therapy was utilized by the clinician during today's follow up session. Clinician met with patient to identify needs related to stressors and functioning, and assess and monitor for signs and symptoms of anxiety and depression, and assess safety. The clinician processed with the patient how they have been doing since the last follow-up session.Clinician encouraged the patient to continue to work on calming techniques (e.g.. paced breathing, deep muscle relaxation, and calming imagery) as a strategy for responding appropriately to anxiety and the urge to avoid situations or self-isolate when they occur and move towards increasing the patients self regulation.  Clinician offered to connect the patient to [facility], Social Determinants of Health Coordinator at the [facility] for assistance regarding Medicaid. The session ended with scheduling. Standardized Assessments completed: GAD-7 and PHQ 9 PHQ-9=13 GAD-7= 09  Assessment: Patient currently experiencing See Above.   Patient may benefit from See Above.  Plan: Follow up with behavioral health clinician on : 11/29/2022 at 4:00 PM Behavioral recommendations:  Referral(s): [place] (In Clinic)  I discussed the assessment and treatment plan with the patient and/or parent/guardian. They were provided an opportunity to ask questions and all were answered.  They agreed with the plan and demonstrated an understanding of the instructions.   They were advised to  call back or seek an in-person evaluation if the symptoms worsen or if the condition fails to improve as anticipated.  Lesli Albee, LCSWA

## 2022-11-22 NOTE — BH Specialist Note (Deleted)
Integrated Behavioral Health via Telemedicine Visit  11/22/2022 Cynthia Hodge [id number]  Number of [place] Clinician visits: No data recorded Session Start time: No data recorded  Session End time: No data recorded Total time in minutes: No data recorded  Referring Provider: Carlyon Shadow, NP Patient/Family location: [facility] Provider location: [facility]  All persons participating in visit: ELIZAH LYDON and Jerrilyn Cairo, LCSW-A Types of Service: Telephone visit  I connected with [name] via  Telephone or Video Enabled Telemedicine Application  (Video is Caregility application) and verified that I am speaking with the correct person using two identifiers. Discussed confidentiality: Yes   I discussed the limitations of telemedicine and the availability of in person appointments.  Discussed there is a possibility of technology failure and discussed alternative modes of communication if that failure occurs.  Patient and/or legal guardian expressed understanding and consented to Telemedicine visit: Yes   Presenting Concerns: Patient and/or family reports the following symptoms/concerns: *** Duration of problem: ***; Severity of problem: moderate  Patient and/or Family's Strengths/Protective Factors: {CHL AMB BH PROTECTIVE FACTORS:[phone]}  Goals Addressed: Patient will:  Reduce symptoms of: {IBH Symptoms:21014056}   Increase knowledge and/or ability of: {IBH Patient Tools:21014057}   Demonstrate ability to: {IBH Goals:21014053}  Progress towards Goals: {CHL AMB BH PROGRESS TOWARDS GOALS:[id number]}  Interventions: Interventions utilized:  CBT Cognitive Behavioral Therapy  Standardized Assessments completed: GAD-7 and PHQ 9  Patient and/or Family Response: ***  Assessment: Patient currently experiencing see above.   Patient may benefit from see above.  Plan: Follow up with behavioral health clinician  on : *** Behavioral recommendations: *** Referral(s): {IBH Referrals:21014055}  I discussed the assessment and treatment plan with the patient and/or parent/guardian. They were provided an opportunity to ask questions and all were answered. They agreed with the plan and demonstrated an understanding of the instructions.   They were advised to call back or seek an in-person evaluation if the symptoms worsen or if the condition fails to improve as anticipated.  Lesli Albee, LCSWA

## 2022-11-29 ENCOUNTER — Ambulatory Visit: Payer: Self-pay | Admitting: Licensed Clinical Social Worker

## 2022-11-29 DIAGNOSIS — F411 Generalized anxiety disorder: Secondary | ICD-10-CM

## 2022-11-29 NOTE — BH Specialist Note (Addendum)
Integrated Behavioral Health via Telemedicine Visit  11/29/2022 TRIVA HUEBER [id number]   Referring Provider: Carlyon Shadow, NP Patient/Family location: [facility] Provider location: [facility] All persons participating in visit: ARIZBETH CAWTHORN and Jerrilyn Cairo, LCSW-A Types of Service: Telephone visit  I connected with [name] via Telephone or Video Enabled Telemedicine Application  (Video is Caregility application) and verified that I am speaking with the correct person using two identifiers. Discussed confidentiality: Yes   I discussed the limitations of telemedicine and the availability of in person appointments.  Discussed there is a possibility of technology failure and discussed alternative modes of communication if that failure occurs.  Patient and/or legal guardian expressed understanding and consented to Telemedicine visit: Yes   Presenting Concerns: Patient and/or family reports the following symptoms/concerns: The patient reported that she has been doing well since her last follow-up session. Aleisha discussed her experience filing for disability and explained that she is hopeful that she will be approved shortly. The patient shared that she was told by the Department of Social Services that her Medicaid application should be approved shortly and she hopes she will be able to see a specialist regarding her blood pressure. Natavia discussed family and health stressors impacting her life currently. She noted that she continues to struggle with feeling anxious and finds it helpful to be around other people. The patient discussed the recent severe weather and explained that she felt very nervous especially with the continuous alerts on TV.  Jazia denied any suicidal or homicidal thoughts. duration of problem: Years; Severity of problem: moderate  Patient and/or Family's Strengths/Protective Factors: Concrete supports in place  (healthy food, safe environments, etc.), Sense of purpose, and Physical Health (exercise, healthy diet, medication compliance, etc.)  Goals Addressed: Patient will:  Reduce symptoms of: agitation, anxiety, depression, insomnia, and stress   Increase knowledge and/or ability of: coping skills, healthy habits, self-management skills, and stress reduction   Demonstrate ability to: Increase healthy adjustment to current life circumstances  Progress towards Goals: Ongoing  Interventions: Interventions utilized:  CBT Cognitive Behavioral Therapy was utilized by the clinician during today's follow up session. Clinician met with patient to identify needs related to stressors and functioning, and assess and monitor for signs and symptoms of anxiety and depression, and assess safety. The clinician processed with the patient how they have been doing since the last follow-up session. Clinician measured the patient's anxiety and depression on the numerical scale.  Clinician continued to teach the patient calming relaxation and mindfulness skills and how to discriminate better between relaxation and tension and taught the client how to apply the skills in their daily life. Clinician assigned the patient homework in which they will use mindfulness skills daily gradually applying them progressively from not anxiety provoking to anxiety provoking situations and worked with the patient to resolve obstacles toward sustained implementation.  The session ended with scheduling. Standardized Assessments completed: GAD-7 and PHQ 9 GAD-7= 09 PHQ-9= 12  Assessment: Patient currently experiencing see above.   Patient may benefit from see above.  Plan: Follow up with behavioral health clinician on : 12/06/2022 at 3:00 PM  Behavioral recommendations:  Referral(s): [place] (In Clinic)  I discussed the assessment and treatment plan with the patient and/or parent/guardian. They were provided  an opportunity to ask questions and all were answered. They agreed with the plan and demonstrated an understanding of the instructions.   They were advised to call back  or seek an in-person evaluation if the symptoms worsen or if the condition fails to improve as anticipated.  Lesli Albee, LCSWA

## 2022-12-06 ENCOUNTER — Encounter: Payer: Self-pay | Admitting: Licensed Clinical Social Worker

## 2022-12-06 ENCOUNTER — Ambulatory Visit: Payer: Self-pay | Admitting: Licensed Clinical Social Worker

## 2022-12-06 DIAGNOSIS — F411 Generalized anxiety disorder: Secondary | ICD-10-CM

## 2022-12-06 DIAGNOSIS — F339 Major depressive disorder, recurrent, unspecified: Secondary | ICD-10-CM

## 2022-12-06 NOTE — BH Specialist Note (Signed)
Integrated Behavioral Health via Telemedicine Visit  12/06/2022 Cynthia Hodge [id number]  Number of [place] Clinician visits: No data recorded Session Start time: No data recorded  Session End time: No data recorded Total time in minutes: No data recorded  Referring Provider: Carlyon Shadow, NP Patient/Family location: [facility] Provider location: [facility] [place] All persons participating in visit: Cynthia Hodge and [employer], LCSW-A Types of Service: Telephone visit  I connected with Cynthia Hodge via Telephone or [employer]  (Video is Caregility application) and verified that I am speaking with the correct person using two identifiers. Discussed confidentiality: Yes   I discussed the limitations of telemedicine and the availability of in person appointments.  Discussed there is a possibility of technology failure and discussed alternative modes of communication if that failure occurs.  Patient and/or legal guardian expressed understanding and consented to Telemedicine visit: Yes   Presenting Concerns: Patient and/or family reports the following symptoms/concerns: The patient reported that she has been doing well since her last follow-up session.  [name] shared that she was approved for Medicaid and was told that she would receive her Medicaid card by tomorrow.  She explained that she would like to see her father's doctor because she is already familiar with him.  The patient discussed financial stressors impacting her life currently.  She noted that she been feeling better overall but continues to struggle with anxiety.  The patient denied any suicidal or homicidal thoughts. Duration of problem: Years; Severity of problem: moderate  Patient and/or Family's Strengths/Protective Factors: Concrete supports in place (healthy food, safe environments, etc.), Sense of purpose, and Physical Health  (exercise, healthy diet, medication compliance, etc.)  Goals Addressed: Patient will:  Reduce symptoms of: agitation, anxiety, depression, insomnia, and stress   Increase knowledge and/or ability of: coping skills, healthy habits, self-management skills, and stress reduction   Demonstrate ability to: Increase healthy adjustment to current life circumstances  Progress towards Goals: Ongoing  Interventions: Interventions utilized:  CBT Cognitive Behavioral Therapywas utilized by the clinician during today's follow up session. Clinician met with patient to identify needs related to stressors and functioning, and assess and monitor for signs and symptoms of anxiety and depression, and assess safety. Clinician encouraged the patient when she received her Medicaid card to call [facility] and let them know. Clinician measured the patient's anxiety and depression on the numerical scale.  Clinician encouraged the patient to continue to work on calming techniques (e.g.. paced breathing, deep muscle relaxation, and calming imagery) as a strategy for responding appropriately to anxiety and the urge to avoid situations or self-isolate when they occur and move towards increasing the patients self regulation.  The session ended with scheduling. Standardized Assessments completed: GAD-7 and PHQ 9 GAD-7=09 PHQ-9=12  Assessment: Patient currently experiencing see above.   Patient may benefit from see above.  Plan: Follow up with behavioral health clinician on : 12/12/2021 at 3:00 PM. Behavioral recommendations:   Referral(s): [place] (In Clinic)  I discussed the assessment and treatment plan with the patient and/or parent/guardian. They were provided an opportunity to ask questions and all were answered. They agreed with the plan and demonstrated an understanding of the instructions.   They were advised to call back or seek an in-person evaluation if the symptoms  worsen or if the condition fails to improve as anticipated.  Lesli Albee, LCSWA

## 2022-12-11 ENCOUNTER — Telehealth: Payer: Self-pay | Admitting: Gerontology

## 2022-12-11 NOTE — Telephone Encounter (Signed)
Sent end of therapy letter for [facility]. Will follow up in two weeks with pt.

## 2022-12-12 ENCOUNTER — Ambulatory Visit: Payer: Self-pay | Admitting: Licensed Clinical Social Worker

## 2022-12-12 ENCOUNTER — Ambulatory Visit: Payer: Medicaid Other | Admitting: Licensed Clinical Social Worker

## 2022-12-12 DIAGNOSIS — F411 Generalized anxiety disorder: Secondary | ICD-10-CM

## 2022-12-12 DIAGNOSIS — F339 Major depressive disorder, recurrent, unspecified: Secondary | ICD-10-CM

## 2022-12-12 NOTE — BH Specialist Note (Signed)
Integrated Behavioral Health via Telemedicine Visit  12/12/2022 Cynthia Hodge [id number]  Number of [place] Clinician visits: No data recorded Session Start time: No data recorded  Session End time: No data recorded Total time in minutes: No data recorded  Referring Provider: Carlyon Shadow, NP Patient/Family location: [facility] Provider location: [facility] [place]  All persons participating in visit: Georgia Delsignore and Jerrilyn Cairo, LCSW-A Types of Service: Telephone visit  I connected with [name] via Telephone or Video Enabled Telemedicine Application  (Video is Caregility application) and verified that I am speaking with the correct person using two identifiers. Discussed confidentiality: No   I discussed the limitations of telemedicine and the availability of in person appointments.  Discussed there is a possibility of technology failure and discussed alternative modes of communication if that failure occurs.  Patient and/or legal guardian expressed understanding and consented to Telemedicine visit: Yes   Presenting Concerns: Patient and/or family reports the following symptoms/concerns: The patient reports that she has been doing well since her last follow-up visit.  The patient asked questions regarding transitioning her care to other providers.  She shared that she is nervous to start therapy with someone new because it takes her a long time to feel comfortable around people she does not know.  The patient discussed her plans to go to the [place] clinic to establish care with a new primary provider.  The patient noted that overall she is doing well.  Salene denied any suicidal or homicidal thoughts. Duration of problem: Years; Severity of problem: moderate  Patient and/or Family's Strengths/Protective Factors: Concrete supports in place (healthy food, safe environments, etc.), Sense of purpose, and Physical  Health (exercise, healthy diet, medication compliance, etc.)  Goals Addressed: Patient will:  Reduce symptoms of: agitation, anxiety, depression, and stress   Increase knowledge and/or ability of: coping skills, healthy habits, self-management skills, and stress reduction   Demonstrate ability to: Increase healthy adjustment to current life circumstances  Progress towards Goals: Ongoing  Interventions: Interventions utilized:  Supportive Counseling was utilized by the clinician during today's follow up session. Clinician met with patient to identify needs related to stressors and functioning, and assess and monitor for signs and symptoms of anxiety and depression, and assess safety. The clinician processed with the patient how they have been doing since the last follow-up session. Clinician reviewed with the patient her progress during therapy and identified areas she would like to continue to work on. Clinician provided the patient with referrals to [facility] and informed her to establish care she could walk-in on Monday Wednesdays or Fridays.  Further the clinician explained that the patient would receive a follow-up letter in the mail and the [facility] would follow-up with her through telephone check-in's to see how her transition is going.  Clinician informed the patient if she experienced a mental health crisis she could utilize [facility]'s crisis services, 34, or go to her nearest emergency department or dial 911. Clinician provided a space for the client to have and questions or concerns addressed.  Standardized Assessments completed: GAD-7 and PHQ 9 PHQ-9=13 GAD-7=10  Patient and/or Family Response: The patient stated she would call if she needed further support transitioning care to a new provider.   Assessment: Patient currently experiencing See above.   Patient may benefit from See above.  Plan: Follow up with behavioral health clinician on : Pt has Medicaid  Behavioral  recommendations:  Referral(s): [place] (  LME/Outside Clinic) referrals provided for [facility]  I discussed the assessment and treatment plan with the patient and/or parent/guardian. They were provided an opportunity to ask questions and all were answered. They agreed with the plan and demonstrated an understanding of the instructions.   They were advised to call back or seek an in-person evaluation if the symptoms worsen or if the condition fails to improve as anticipated.  Lesli Albee, LCSWA

## 2022-12-18 ENCOUNTER — Other Ambulatory Visit: Payer: Self-pay

## 2022-12-19 ENCOUNTER — Ambulatory Visit: Payer: Medicaid Other | Admitting: Licensed Clinical Social Worker

## 2022-12-20 DIAGNOSIS — Z419 Encounter for procedure for purposes other than remedying health state, unspecified: Secondary | ICD-10-CM | POA: Diagnosis not present

## 2022-12-23 ENCOUNTER — Other Ambulatory Visit: Payer: Self-pay

## 2022-12-24 ENCOUNTER — Other Ambulatory Visit: Payer: Self-pay

## 2022-12-24 DIAGNOSIS — F4310 Post-traumatic stress disorder, unspecified: Secondary | ICD-10-CM | POA: Diagnosis not present

## 2022-12-25 ENCOUNTER — Ambulatory Visit: Payer: Self-pay | Admitting: Rheumatology

## 2023-01-16 ENCOUNTER — Other Ambulatory Visit: Payer: Self-pay

## 2023-01-16 ENCOUNTER — Other Ambulatory Visit: Payer: Self-pay | Admitting: Gerontology

## 2023-01-16 DIAGNOSIS — I10 Essential (primary) hypertension: Secondary | ICD-10-CM

## 2023-01-18 ENCOUNTER — Other Ambulatory Visit: Payer: Self-pay

## 2023-01-18 ENCOUNTER — Other Ambulatory Visit: Payer: Self-pay | Admitting: Gerontology

## 2023-01-18 DIAGNOSIS — Z419 Encounter for procedure for purposes other than remedying health state, unspecified: Secondary | ICD-10-CM | POA: Diagnosis not present

## 2023-01-18 DIAGNOSIS — I10 Essential (primary) hypertension: Secondary | ICD-10-CM

## 2023-01-18 MED ORDER — METOPROLOL TARTRATE 50 MG PO TABS
50.0000 mg | ORAL_TABLET | Freq: Two times a day (BID) | ORAL | 1 refills | Status: DC
  Filled 2023-01-18: qty 60, 30d supply, fill #0
  Filled 2023-02-20: qty 60, 30d supply, fill #1

## 2023-01-22 ENCOUNTER — Ambulatory Visit: Payer: Self-pay | Admitting: Rheumatology

## 2023-01-30 ENCOUNTER — Ambulatory Visit: Payer: Self-pay | Admitting: Gerontology

## 2023-02-12 ENCOUNTER — Telehealth: Payer: Self-pay

## 2023-02-12 NOTE — Telephone Encounter (Signed)
Called pt to follow up on end of therapy letter sent in Jan and see if pt needed any additional help finding a new therapist. No answer. Mailbox full.

## 2023-02-18 DIAGNOSIS — Z419 Encounter for procedure for purposes other than remedying health state, unspecified: Secondary | ICD-10-CM | POA: Diagnosis not present

## 2023-02-20 ENCOUNTER — Other Ambulatory Visit: Payer: Self-pay | Admitting: Gerontology

## 2023-02-20 DIAGNOSIS — I10 Essential (primary) hypertension: Secondary | ICD-10-CM

## 2023-02-21 ENCOUNTER — Other Ambulatory Visit: Payer: Self-pay

## 2023-02-21 MED ORDER — LISINOPRIL 20 MG PO TABS
20.0000 mg | ORAL_TABLET | Freq: Every day | ORAL | 0 refills | Status: DC
  Filled 2023-02-21: qty 30, 30d supply, fill #0

## 2023-02-21 NOTE — Telephone Encounter (Signed)
Refill x 1 month only. Patient will need to request future refills with new PCP.

## 2023-03-15 ENCOUNTER — Ambulatory Visit: Payer: Medicaid Other | Admitting: Internal Medicine

## 2023-03-15 ENCOUNTER — Other Ambulatory Visit: Payer: Self-pay

## 2023-03-15 ENCOUNTER — Telehealth: Payer: Self-pay

## 2023-03-15 ENCOUNTER — Encounter: Payer: Self-pay | Admitting: Internal Medicine

## 2023-03-15 ENCOUNTER — Other Ambulatory Visit (HOSPITAL_COMMUNITY)
Admission: RE | Admit: 2023-03-15 | Discharge: 2023-03-15 | Disposition: A | Discharge status: Home / Self Care | Payer: Medicaid Other | Source: Ambulatory Visit | Attending: Internal Medicine | Admitting: Internal Medicine

## 2023-03-15 VITALS — BP 122/94 | HR 93 | Temp 97.9°F | Resp 18 | Ht 65.0 in | Wt 243.8 lb

## 2023-03-15 DIAGNOSIS — Z1211 Encounter for screening for malignant neoplasm of colon: Secondary | ICD-10-CM

## 2023-03-15 DIAGNOSIS — E782 Mixed hyperlipidemia: Secondary | ICD-10-CM | POA: Diagnosis not present

## 2023-03-15 DIAGNOSIS — R768 Other specified abnormal immunological findings in serum: Secondary | ICD-10-CM | POA: Diagnosis not present

## 2023-03-15 DIAGNOSIS — N898 Other specified noninflammatory disorders of vagina: Secondary | ICD-10-CM

## 2023-03-15 DIAGNOSIS — R7303 Prediabetes: Secondary | ICD-10-CM

## 2023-03-15 DIAGNOSIS — I10 Essential (primary) hypertension: Secondary | ICD-10-CM

## 2023-03-15 LAB — POCT GLYCOSYLATED HEMOGLOBIN (HGB A1C): Hemoglobin A1C: 5.9 % — AB (ref 4.0–5.6)

## 2023-03-15 MED ORDER — NA SULFATE-K SULFATE-MG SULF 17.5-3.13-1.6 GM/177ML PO SOLN
1.0000 | Freq: Once | ORAL | 0 refills | Status: AC
  Filled 2023-03-15: qty 354, 1d supply, fill #0

## 2023-03-15 MED ORDER — LISINOPRIL 20 MG PO TABS
20.0000 mg | ORAL_TABLET | Freq: Every day | ORAL | 1 refills | Status: DC
Start: 2023-03-15 — End: 2023-09-17
  Filled 2023-03-15: qty 90, 90d supply, fill #0
  Filled 2023-06-16: qty 90, 90d supply, fill #1

## 2023-03-15 MED ORDER — METOPROLOL TARTRATE 50 MG PO TABS
50.0000 mg | ORAL_TABLET | Freq: Two times a day (BID) | ORAL | 1 refills | Status: DC
Start: 2023-03-15 — End: 2023-09-17
  Filled 2023-03-15: qty 180, 90d supply, fill #0
  Filled 2023-06-20 (×2): qty 180, 90d supply, fill #1

## 2023-03-15 MED ORDER — FLUCONAZOLE 150 MG PO TABS
150.0000 mg | ORAL_TABLET | Freq: Once | ORAL | 0 refills | Status: AC
Start: 2023-03-15 — End: 2023-03-18
  Filled 2023-03-15: qty 3, 3d supply, fill #0

## 2023-03-15 NOTE — Progress Notes (Signed)
New Patient Office Visit  Subjective    Patient ID: Cynthia Hodge, female    DOB: [birth year]  Age: 47 y.o. MRN: [id number]  CC:  Chief Complaint  Patient presents with   Establish Care    HPI Cynthia Hodge presents to establish care. Was hospitalized in December 2022 after accidentally being immersed in [place]. She was admitted for 10 days per the patient, diagnosed with sepsis and abdominal swelling.   Hypertension: -Medications: Lisinopril 20 mg, Metoprolol 50 mg BID -Patient is compliant with above medications and reports no side effects. -Checking BP at home (average): doesn't check regularly  -Denies any SOB, CP, vision changes, LE edema or symptoms of hypotension  HLD: -Medications: Nothing  -Last lipid panel: Lipid Panel     Component Value Date/Time   CHOL 203 (H) 10/17/2022 1215   TRIG 179 (H) 10/17/2022 1215   HDL 34 (L) 10/17/2022 1215   CHOLHDL 6.0 (H) 10/17/2022 1215   LDLCALC 137 (H) 10/17/2022 1215   LABVLDL 32 10/17/2022 1215   The 10-year ASCVD risk score (Arnett DK, et al., 2019) is: 2.5%   Values used to calculate the score:     Age: 55 years     Sex: Female     Is Non-Hispanic African American: No     Diabetic: No     Tobacco smoker: No     Systolic Blood Pressure: 122 mmHg     Is BP treated: Yes     HDL Cholesterol: 34 mg/dL     Total Cholesterol: 203 mg/dL  Pre-Diabetes: -Last W0J 6/23 5.8%, was 6.3% 1 year ago -Not on any medication currently  History of positive ANA: -Positive in 11/23 -Had been following with Rheumatology   Recurrent Yeast Infections: -Does have current vaginal itching and change in vaginal discharge  Health Maintenance: -Blood work UTD -Pap due -Colon cancer screening due  Outpatient Encounter Medications as of 03/15/2023  Medication Sig   Blood Pressure KIT USE AS DIRECTED DAILY.   lisinopril (ZESTRIL) 20 MG tablet Take 1 tablet (20 mg total) by mouth once daily.   metoprolol tartrate (LOPRESSOR) 50  MG tablet Take 1 tablet (50 mg total) by mouth 2 (two) times daily.   [DISCONTINUED] sodium chloride (OCEAN) 0.65 % SOLN nasal spray Place 1 spray into both nostrils as needed for congestion.   No facility-administered encounter medications on file as of 03/15/2023.    Past Medical History:  Diagnosis Date   Hypertension     Past Surgical History:  Procedure Laterality Date   NO PAST SURGERIES      Family History  Problem Relation Age of Onset   Stroke Mother    Dementia Mother    Hypertension Father    Hypertension Sister    Diabetes Paternal Uncle    Other Maternal Grandmother        childbirth   Other Maternal Grandfather        carbon monoxide posioning   Cervical cancer Paternal Grandmother    Diabetes Paternal Grandfather     Social History   Socioeconomic History   Marital status: Single    Spouse name: Not on file   Number of children: Not on file   Years of education: Not on file   Highest education level: Not on file  Occupational History   Not on file  Tobacco Use   Smoking status: Never   Smokeless tobacco: Never  Vaping Use   Vaping Use: Never used  Substance and  Sexual Activity   Alcohol use: Not Currently   Drug use: Never   Sexual activity: Not Currently  Other Topics Concern   Not on file  Social History Narrative   Not on file   Social Determinants of Health   Financial Resource Strain: Not on file  Food Insecurity: No Food Insecurity (11/23/2021)   Hunger Vital Sign    Worried About Running Out of Food in the Last Year: Never true    Ran Out of Food in the Last Year: Never true  Transportation Needs: No Transportation Needs (11/23/2021)   PRAPARE - Administrator, Civil Service (Medical): No    Lack of Transportation (Non-Medical): No  Physical Activity: Not on file  Stress: Not on file  Social Connections: Not on file  Intimate Partner Violence: Not on file    Review of Systems  Constitutional:  Negative for chills and  fever.  Respiratory:  Negative for shortness of breath.   Cardiovascular:  Negative for chest pain.        Objective    BP (!) 122/94   Pulse 93   Temp 97.9 F (36.6 C)   Resp 18   Ht 5\' 5"  (1.651 m)   Wt 243 lb 12.8 oz (110.6 kg)   LMP 02/12/2023   SpO2 95%   BMI 40.57 kg/m   Physical Exam Constitutional:      Appearance: Normal appearance.  HENT:     Head: Normocephalic and atraumatic.     Mouth/Throat:     Mouth: Mucous membranes are moist.     Pharynx: Oropharynx is clear.  Eyes:     Conjunctiva/sclera: Conjunctivae normal.  Cardiovascular:     Rate and Rhythm: Normal rate and regular rhythm.  Pulmonary:     Effort: Pulmonary effort is normal.     Breath sounds: Normal breath sounds.  Musculoskeletal:     Right lower leg: No edema.     Left lower leg: No edema.  Skin:    General: Skin is warm and dry.  Neurological:     General: No focal deficit present.     Mental Status: She is alert. Mental status is at baseline.  Psychiatric:        Mood and Affect: Mood normal.        Behavior: Behavior normal.         Assessment & Plan:   1. Essential hypertension: Has been taking blood pressure medication since her hospitalization in December 2022.  Blood pressure is at goal here today.  Continue lisinopril 20 mg and metoprolol 50 mg twice daily, refilled.  - lisinopril (ZESTRIL) 20 MG tablet; Take 1 tablet (20 mg total) by mouth once daily.  Dispense: 90 tablet; Refill: 1 - metoprolol tartrate (LOPRESSOR) 50 MG tablet; Take 1 tablet (50 mg total) by mouth 2 (two) times daily.  Dispense: 180 tablet; Refill: 1  2. Moderate mixed hyperlipidemia not requiring statin therapy: Reviewed lipid panel with the patient from November 2023, LDL was elevated at the time.  ASCVD risk considered low at 2.5%.  Discussed decreasing red meats and fatty processed foods in the patient's diet with plans to recheck at follow-up.  3. Prediabetes: A1c here today with prediabetes at  5.9%.  Patient counseled about lifestyle modifications.  - POCT HgB A1C  4. Vaginal itching: Vaginal swab obtained today, will prophylactically treat with Diflucan.  - Cervicovaginal ancillary only - fluconazole (DIFLUCAN) 150 MG tablet; Take 1 tablet (150 mg total) by mouth  once for 1 dose.  Dispense: 3 tablet; Refill: 0  5. Positive ANA (antinuclear antibody): Referral to rheumatology placed for history of positive ANAs.  - Ambulatory referral to Rheumatology  6. Screening for colon cancer: Referral placed to GI for colon cancer screening.  - Ambulatory referral to Gastroenterology   Return in about 7 months (around 10/15/2023) for CPE w/Pap.   Margarita Mail, DO

## 2023-03-15 NOTE — Patient Instructions (Addendum)
It was great seeing you today!  Plan discussed at today's visit: -Blood pressure medications refilled today -Yeast infection medication prescribed - vaginal swab ordered as well -Referrals placed to Rheumatology and GI for colonoscopy   Follow up in: 7 months for Pap and fasting blood work  Take care and let us know if you have any questions or concerns prior to your next visit.  Dr. Caralee Ates

## 2023-03-15 NOTE — Telephone Encounter (Signed)
Gastroenterology Pre-Procedure Review  Request Date: 04/09/23 Requesting Physician: Dr. Allegra Lai  PATIENT REVIEW QUESTIONS: The patient responded to the following health history questions as indicated:    1. Are you having any GI issues? no 2. Do you have a personal history of Polyps? no 3. Do you have a family history of Colon Cancer or Polyps? yes (dad colon polyps) 4. Diabetes Mellitus? no 5. Joint replacements in the past 12 months?no 6. Major health problems in the past 3 months?no 7. Any artificial heart valves, MVP, or defibrillator?no    MEDICATIONS & ALLERGIES:    Patient reports the following regarding taking any anticoagulation/antiplatelet therapy:   Plavix, Coumadin, Eliquis, Xarelto, Lovenox, Pradaxa, Brilinta, or Effient? no Aspirin? no  Patient confirms/reports the following medications:  Current Outpatient Medications  Medication Sig Dispense Refill   Blood Pressure KIT USE AS DIRECTED DAILY. 1 kit 0   fluconazole (DIFLUCAN) 150 MG tablet Take 1 tablet (150 mg total) by mouth once for 1 dose. 3 tablet 0   lisinopril (ZESTRIL) 20 MG tablet Take 1 tablet (20 mg total) by mouth once daily. 90 tablet 1   metoprolol tartrate (LOPRESSOR) 50 MG tablet Take 1 tablet (50 mg total) by mouth 2 (two) times daily. 180 tablet 1   No current facility-administered medications for this visit.    Patient confirms/reports the following allergies:  Allergies  Allergen Reactions   Abilify [Aripiprazole] Other (See Comments)    Extreme sleepiness and increased appetite    No orders of the defined types were placed in this encounter.   AUTHORIZATION INFORMATION Primary Insurance: 1D#: Group #:  Secondary Insurance: 1D#: Group #:  SCHEDULE INFORMATION: Date: 04/09/23 Time: Location: [facility]

## 2023-03-18 ENCOUNTER — Ambulatory Visit: Payer: Self-pay | Admitting: *Deleted

## 2023-03-18 LAB — CERVICOVAGINAL ANCILLARY ONLY
Bacterial Vaginitis (gardnerella): NEGATIVE
Candida Glabrata: NEGATIVE
Candida Vaginitis: NEGATIVE
Comment: NEGATIVE
Comment: NEGATIVE
Comment: NEGATIVE
Comment: NEGATIVE
Trichomonas: NEGATIVE

## 2023-03-18 NOTE — Telephone Encounter (Signed)
Summary: Cough & Sore throat Advice   Pt is calling to report that seen in office on 03/18/23 - failed to report and cough, and sore throat. What can the patient take. Please advise          Chief Complaint: cough Symptoms: dry cough, sore throat- but is better today Frequency: cough- 1 week Pertinent Negatives: Patient denies fever, runny nose Disposition: [] ED /[] Urgent Care (no appt availability in office) / [] Appointment(In office/virtual)/ []  [facility]/ [x] Home Care/ [] Refused Recommended Disposition /[] [facility]/ []  Follow-up with PCP Additional Notes: Patient is calling for advised on OTC treatment for dry cough- bothersome to her. Patient advised per protocol- she will call back if gets worse.  Reason for Disposition  Cough  Answer Assessment - Initial Assessment Questions 1. ONSET: "When did the cough begin?"      1 week 2. SEVERITY: "How bad is the cough today?"      More bothersome- dry cough- sore thorat and chest soreness from the cough 3. SPUTUM: "Describe the color of your sputum" (none, dry cough; clear, white, yellow, green)     Last night and today feeling mucus 4. HEMOPTYSIS: "Are you coughing up any blood?" If so ask: "How much?" (flecks, streaks, tablespoons, etc.)     na 5. DIFFICULTY BREATHING: "Are you having difficulty breathing?" If Yes, ask: "How bad is it?" (e.g., mild, moderate, severe)    - MILD: No SOB at rest, mild SOB with walking, speaks normally in sentences, can lie down, no retractions, pulse < 100.    - MODERATE: SOB at rest, SOB with minimal exertion and prefers to sit, cannot lie down flat, speaks in phrases, mild retractions, audible wheezing, pulse 100-120.    - SEVERE: Very SOB at rest, speaks in single words, struggling to breathe, sitting hunched forward, retractions, pulse > 120      normal 6. FEVER: "Do you have a fever?" If Yes, ask: "What is your temperature, how was it measured, and when did it start?"      no   10. OTHER SYMPTOMS: "Do you have any other symptoms?" (e.g., runny nose, wheezing, chest pain)       Sore throat, chills at times  Protocols used: Cough - Acute Non-Productive-A-AH

## 2023-03-20 DIAGNOSIS — Z419 Encounter for procedure for purposes other than remedying health state, unspecified: Secondary | ICD-10-CM | POA: Diagnosis not present

## 2023-04-02 ENCOUNTER — Encounter: Payer: Self-pay | Admitting: Gastroenterology

## 2023-04-03 ENCOUNTER — Other Ambulatory Visit: Payer: Self-pay

## 2023-04-09 ENCOUNTER — Encounter
Admission: RE | Disposition: A | Discharge status: Home / Self Care | Payer: Self-pay | Source: Home / Self Care | Attending: Gastroenterology

## 2023-04-09 ENCOUNTER — Ambulatory Visit: Payer: Medicaid Other | Admitting: Anesthesiology

## 2023-04-09 ENCOUNTER — Encounter: Payer: Self-pay | Admitting: Gastroenterology

## 2023-04-09 ENCOUNTER — Ambulatory Visit
Admission: RE | Admit: 2023-04-09 | Discharge: 2023-04-09 | Disposition: A | Discharge status: Home / Self Care | Payer: Medicaid Other | Attending: Gastroenterology | Admitting: Gastroenterology

## 2023-04-09 DIAGNOSIS — Z1211 Encounter for screening for malignant neoplasm of colon: Secondary | ICD-10-CM | POA: Diagnosis not present

## 2023-04-09 DIAGNOSIS — I10 Essential (primary) hypertension: Secondary | ICD-10-CM | POA: Diagnosis not present

## 2023-04-09 HISTORY — PX: COLONOSCOPY WITH PROPOFOL: SHX5780

## 2023-04-09 LAB — HM COLONOSCOPY

## 2023-04-09 SURGERY — COLONOSCOPY WITH PROPOFOL
Anesthesia: General

## 2023-04-09 MED ORDER — PROPOFOL 500 MG/50ML IV EMUL
INTRAVENOUS | Status: DC | PRN
  Administered 2023-04-09: 150 ug/kg/min via INTRAVENOUS

## 2023-04-09 MED ORDER — LIDOCAINE HCL (CARDIAC) PF 100 MG/5ML IV SOSY
PREFILLED_SYRINGE | INTRAVENOUS | Status: DC | PRN
  Administered 2023-04-09: 60 mg via INTRAVENOUS

## 2023-04-09 MED ORDER — SODIUM CHLORIDE 0.9 % IV SOLN
INTRAVENOUS | Status: DC
  Administered 2023-04-09: 1000 mL via INTRAVENOUS

## 2023-04-09 MED ORDER — METOPROLOL TARTRATE 5 MG/5ML IV SOLN
INTRAVENOUS | Status: AC
  Filled 2023-04-09: qty 5

## 2023-04-09 MED ORDER — HYDRALAZINE HCL 20 MG/ML IJ SOLN
INTRAMUSCULAR | Status: AC
  Filled 2023-04-09: qty 1

## 2023-04-09 MED ORDER — METOPROLOL TARTRATE 5 MG/5ML IV SOLN
INTRAVENOUS | Status: DC | PRN
  Administered 2023-04-09: 5 mg via INTRAVENOUS

## 2023-04-09 MED ORDER — HYDRALAZINE HCL 20 MG/ML IJ SOLN
INTRAMUSCULAR | Status: DC | PRN
  Administered 2023-04-09 (×2): 10 mg via INTRAVENOUS

## 2023-04-09 MED ORDER — PROPOFOL 10 MG/ML IV BOLUS
INTRAVENOUS | Status: DC | PRN
  Administered 2023-04-09: 100 mg via INTRAVENOUS

## 2023-04-09 NOTE — H&P (Signed)
Arlyss Repress, MD [address]  [address]  [place], [place] [address]  Main: [phone]  Fax: [phone] Pager: [phone]  Primary Care Physician:  Margarita Mail, DO Primary Gastroenterologist:  Dr. Arlyss Repress  Pre-Procedure History & Physical: HPI:  Cynthia Hodge is a 47 y.o. female is here for an colonoscopy.   Past Medical History:  Diagnosis Date   Hypertension     Past Surgical History:  Procedure Laterality Date   NO PAST SURGERIES      Prior to Admission medications   Medication Sig Start Date End Date Taking? Authorizing Provider  Blood Pressure KIT USE AS DIRECTED DAILY. 11/23/21   Iloabachie, Chioma E, NP  lisinopril (ZESTRIL) 20 MG tablet Take 1 tablet (20 mg total) by mouth once daily. 03/15/23   Margarita Mail, DO  metoprolol tartrate (LOPRESSOR) 50 MG tablet Take 1 tablet (50 mg total) by mouth 2 (two) times daily. 03/15/23   Margarita Mail, DO    Allergies as of 03/15/2023 - Review Complete 03/15/2023  Allergen Reaction Noted   Abilify [aripiprazole] Other (See Comments) 10/16/2022    Family History  Problem Relation Age of Onset   Stroke Mother    Dementia Mother    Hypertension Father    Hypertension Sister    Diabetes Paternal Uncle    Other Maternal Grandmother        childbirth   Other Maternal Grandfather        carbon monoxide posioning   Cervical cancer Paternal Grandmother    Diabetes Paternal Grandfather     Social History   Socioeconomic History   Marital status: Single    Spouse name: Not on file   Number of children: Not on file   Years of education: Not on file   Highest education level: Not on file  Occupational History   Not on file  Tobacco Use   Smoking status: Never   Smokeless tobacco: Never  Vaping Use   Vaping Use: Never used  Substance and Sexual Activity   Alcohol use: Not Currently   Drug use: Never   Sexual activity: Not Currently  Other Topics Concern   Not on file  Social  History Narrative   Not on file   Social Determinants of Health   Financial Resource Strain: Not on file  Food Insecurity: No Food Insecurity (11/23/2021)   Hunger Vital Sign    Worried About Running Out of Food in the Last Year: Never true    Ran Out of Food in the Last Year: Never true  Transportation Needs: No Transportation Needs (11/23/2021)   PRAPARE - Administrator, Civil Service (Medical): No    Lack of Transportation (Non-Medical): No  Physical Activity: Not on file  Stress: Not on file  Social Connections: Not on file  Intimate Partner Violence: Not on file    Review of Systems: See HPI, otherwise negative ROS  Physical Exam: BP (!) 233/124   Pulse (!) 124   Temp (!) 97.1 F (36.2 C) (Temporal)   Resp 20   Ht 5\' 5"  (1.651 m)   Wt 107.3 kg   LMP 02/12/2023   SpO2 98%   BMI 39.35 kg/m  General:   Alert,  pleasant and cooperative in NAD Head:  Normocephalic and atraumatic. Neck:  Supple; no masses or thyromegaly. Lungs:  Clear throughout to auscultation.    Heart:  Regular rate and rhythm. Abdomen:  Soft, nontender and nondistended. Normal bowel sounds, without guarding,  and without rebound.   Neurologic:  Alert and  oriented x4;  grossly normal neurologically.  Impression/Plan: Cynthia Hodge is here for an colonoscopy to be performed for colon cancer screening  Risks, benefits, limitations, and alternatives regarding  colonoscopy have been reviewed with the patient.  Questions have been answered.  All parties agreeable.   Lannette Donath, MD  04/09/2023, 8:30 AM

## 2023-04-09 NOTE — Anesthesia Preprocedure Evaluation (Addendum)
Anesthesia Evaluation  Patient identified by MRN, date of birth, ID band Patient awake    Reviewed: Allergy & Precautions, NPO status , Patient's Chart, lab work & pertinent test results  Airway Mallampati: III  TM Distance: <3 FB Neck ROM: full    Dental  (+) Chipped   Pulmonary neg pulmonary ROS, neg shortness of breath   Pulmonary exam normal        Cardiovascular Exercise Tolerance: Good hypertension, (-) angina  Rate:Tachycardia     Neuro/Psych negative neurological ROS  negative psych ROS   GI/Hepatic negative GI ROS, Neg liver ROS,neg GERD  ,,  Endo/Other  negative endocrine ROS    Renal/GU negative Renal ROS  negative genitourinary   Musculoskeletal   Abdominal   Peds  Hematology negative hematology ROS (+)   Anesthesia Other Findings Past Medical History: No date: Hypertension  Past Surgical History: No date: NO PAST SURGERIES  BMI    Body Mass Index: 39.35 kg/m      Reproductive/Obstetrics negative OB ROS                             Anesthesia Physical Anesthesia Plan  ASA: 3  Anesthesia Plan: General   Post-op Pain Management:    Induction: Intravenous  PONV Risk Score and Plan: Propofol infusion and TIVA  Airway Management Planned: Natural Airway and Nasal Cannula  Additional Equipment:   Intra-op Plan:   Post-operative Plan:   Informed Consent: I have reviewed the patients History and Physical, chart, labs and discussed the procedure including the risks, benefits and alternatives for the proposed anesthesia with the patient or authorized representative who has indicated his/her understanding and acceptance.     Dental Advisory Given  Plan Discussed with: Anesthesiologist, CRNA and Surgeon  Anesthesia Plan Comments: (Patient did not take her anti hypotensives today and is hypertensive so plan to pre treat in PreOp  Patient consented for risks of  anesthesia including but not limited to:  - adverse reactions to medications - risk of airway placement if required - damage to eyes, teeth, lips or other oral mucosa - nerve damage due to positioning  - sore throat or hoarseness - Damage to heart, brain, nerves, lungs, other parts of body or loss of life  Patient voiced understanding.)       Anesthesia Quick Evaluation

## 2023-04-09 NOTE — Op Note (Signed)
[facility] Gastroenterology Patient Name: Cynthia Hodge Procedure Date: 04/09/2023 8:28 AM MRN: [id number] Account #: [id number] Date of Birth: 05-23-1976 Admit Type: Outpatient Age: 47 Room: [facility] ENDO ROOM 3 Gender: Female Note Status: Finalized Instrument Name: Nelda Marseille [id number] Procedure:             Colonoscopy Indications:           Screening for colorectal malignant neoplasm, This is                         the patient's first colonoscopy Providers:             Toney Reil MD, MD Referring MD:          Toney Reil MD, MD (Referring MD), No Local Md,                         MD (Referring MD) Medicines:             General Anesthesia Complications:         No immediate complications. Estimated blood loss: None. Procedure:             Pre-Anesthesia Assessment:                        - Prior to the procedure, a History and Physical was                         performed, and patient medications and allergies were                         reviewed. The patient is competent. The risks and                         benefits of the procedure and the sedation options and                         risks were discussed with the patient. All questions                         were answered and informed consent was obtained.                         Patient identification and proposed procedure were                         verified by the physician, the nurse, the                         anesthesiologist, the anesthetist and the technician                         in the pre-procedure area in the procedure room in the                         endoscopy suite. Mental Status Examination: alert and                         oriented. Airway Examination: normal oropharyngeal  airway and neck mobility. Respiratory Examination:                         clear to auscultation. CV Examination: normal.                         Prophylactic Antibiotics:  The patient does not require                         prophylactic antibiotics. Prior Anticoagulants: The                         patient has taken no anticoagulant or antiplatelet                         agents. ASA Grade Assessment: III - A patient with                         severe systemic disease. After reviewing the risks and                         benefits, the patient was deemed in satisfactory                         condition to undergo the procedure. The anesthesia                         plan was to use general anesthesia. Immediately prior                         to administration of medications, the patient was                         re-assessed for adequacy to receive sedatives. The                         heart rate, respiratory rate, oxygen saturations,                         blood pressure, adequacy of pulmonary ventilation, and                         response to care were monitored throughout the                         procedure. The physical status of the patient was                         re-assessed after the procedure.                        After obtaining informed consent, the colonoscope was                         passed under direct vision. Throughout the procedure,                         the patient's blood pressure, pulse, and oxygen  saturations were monitored continuously. The                         Colonoscope was introduced through the anus and                         advanced to the the cecum, identified by appendiceal                         orifice and ileocecal valve. The colonoscopy was                         performed without difficulty. The patient tolerated                         the procedure well. The quality of the bowel                         preparation was evaluated using the BBPS [facility] Bowel                         Preparation Scale) with scores of: Right Colon = 3,                         Transverse Colon  = 3 and Left Colon = 3 (entire mucosa                         seen well with no residual staining, small fragments                         of stool or opaque liquid). The total BBPS score                         equals 9. The ileocecal valve, appendiceal orifice,                         and rectum were photographed. Findings:      The perianal and digital rectal examinations were normal. Pertinent       negatives include normal sphincter tone and no palpable rectal lesions.      The entire examined colon appeared normal.      The retroflexed view of the distal rectum and anal verge was normal and       showed no anal or rectal abnormalities. Impression:            - The entire examined colon is normal.                        - The distal rectum and anal verge are normal on                         retroflexion view.                        - No specimens collected. Recommendation:        - Discharge patient to home (with escort).                        -  Resume previous diet today.                        - Continue present medications.                        - Repeat colonoscopy in 10 years for screening                         purposes. Procedure Code(s):     --- Professional ---                        H8469, Colorectal cancer screening; colonoscopy on                         individual not meeting criteria for high risk Diagnosis Code(s):     --- Professional ---                        Z12.11, Encounter for screening for malignant neoplasm                         of colon CPT copyright 2022 American Medical Association. All rights reserved. The codes documented in this report are preliminary and upon coder review may  be revised to meet current compliance requirements. Dr. Libby Maw Toney Reil MD, MD 04/09/2023 9:00:09 AM This report has been signed electronically. Number of Addenda: 0 Note Initiated On: 04/09/2023 8:28 AM Scope Withdrawal Time: 0 hours 6 minutes 50  seconds  Total Procedure Duration: 0 hours 11 minutes 7 seconds  Estimated Blood Loss:  Estimated blood loss: none.      [facility]

## 2023-04-09 NOTE — Anesthesia Postprocedure Evaluation (Signed)
Anesthesia Post Note  Patient: Cynthia Hodge  Procedure(s) Performed: COLONOSCOPY WITH PROPOFOL  Patient location during evaluation: Endoscopy Anesthesia Type: General Level of consciousness: awake and alert Pain management: pain level controlled Vital Signs Assessment: post-procedure vital signs reviewed and stable Respiratory status: spontaneous breathing, nonlabored ventilation, respiratory function stable and patient connected to nasal cannula oxygen Cardiovascular status: blood pressure returned to baseline and stable Postop Assessment: no apparent nausea or vomiting Anesthetic complications: no   No notable events documented.   Last Vitals:  Vitals:   04/09/23 0907 04/09/23 0917  BP: (!) 136/91 118/89  Pulse: (!) 107 99  Resp: (!) 25 (!) 21  Temp:    SpO2: 97% 93%    Last Pain:  Vitals:   04/09/23 0917  TempSrc:   PainSc: 0-No pain                 Cleda Mccreedy Mauri Temkin

## 2023-04-09 NOTE — Anesthesia Procedure Notes (Signed)
Procedure Name: General with mask airway Date/Time: 04/09/2023 8:38 AM  Performed by: Lily Lovings, CRNAPre-anesthesia Checklist: Patient identified, Emergency Drugs available, Suction available, Timeout performed and Patient being monitored Patient Re-evaluated:Patient Re-evaluated prior to induction Oxygen Delivery Method: Simple face mask Preoxygenation: Pre-oxygenation with 100% oxygen Induction Type: IV induction

## 2023-04-09 NOTE — Transfer of Care (Signed)
Immediate Anesthesia Transfer of Care Note  Patient: Cynthia Hodge  Procedure(s) Performed: COLONOSCOPY WITH PROPOFOL  Patient Location: Endoscopy Unit  Anesthesia Type:General  Level of Consciousness: awake and patient cooperative  Airway & Oxygen Therapy: Patient Spontanous Breathing and Patient connected to face mask oxygen  Post-op Assessment: Report given to RN and Patient moving all extremities X 4  Post vital signs: Reviewed and stable  Last Vitals:  Vitals Value Taken Time  BP 136/91 04/09/23 0907  Temp    Pulse 101 04/09/23 0909  Resp 17 04/09/23 0909  SpO2 95 % 04/09/23 0909  Vitals shown include unvalidated device data.  Last Pain:  Vitals:   04/09/23 0907  TempSrc:   PainSc: 0-No pain         Complications: No notable events documented.

## 2023-04-10 ENCOUNTER — Encounter: Payer: Self-pay | Admitting: Gastroenterology

## 2023-04-13 IMAGING — CR DG CHEST 2V
2 series · 2 of 2 positions shown · non-contrast
Comparison: None.

CLINICAL DATA: 45-year-old female with progressive lower extremity
swelling for 1 month. Suspected new onset CHF.

EXAM:
CHEST - 2 VIEW

[chest pa]
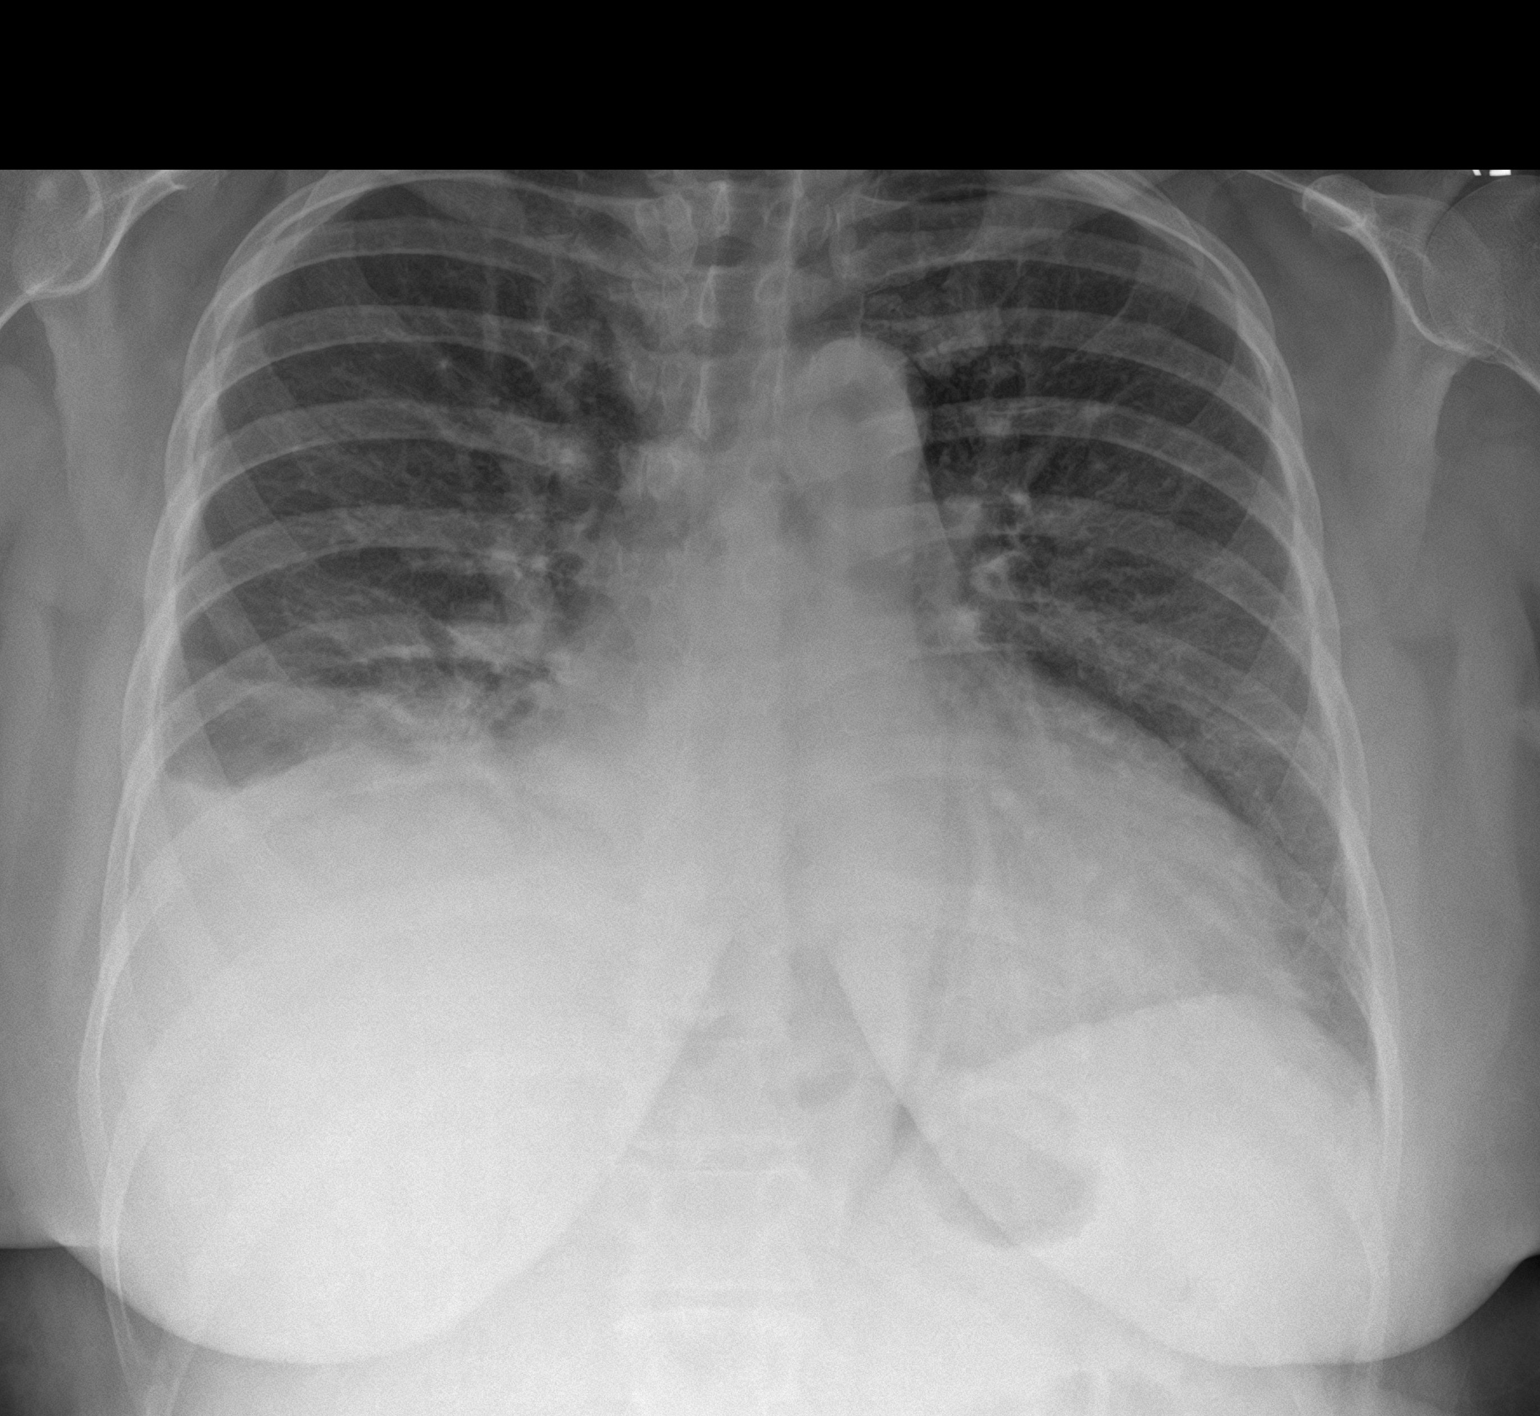

[chest lat]
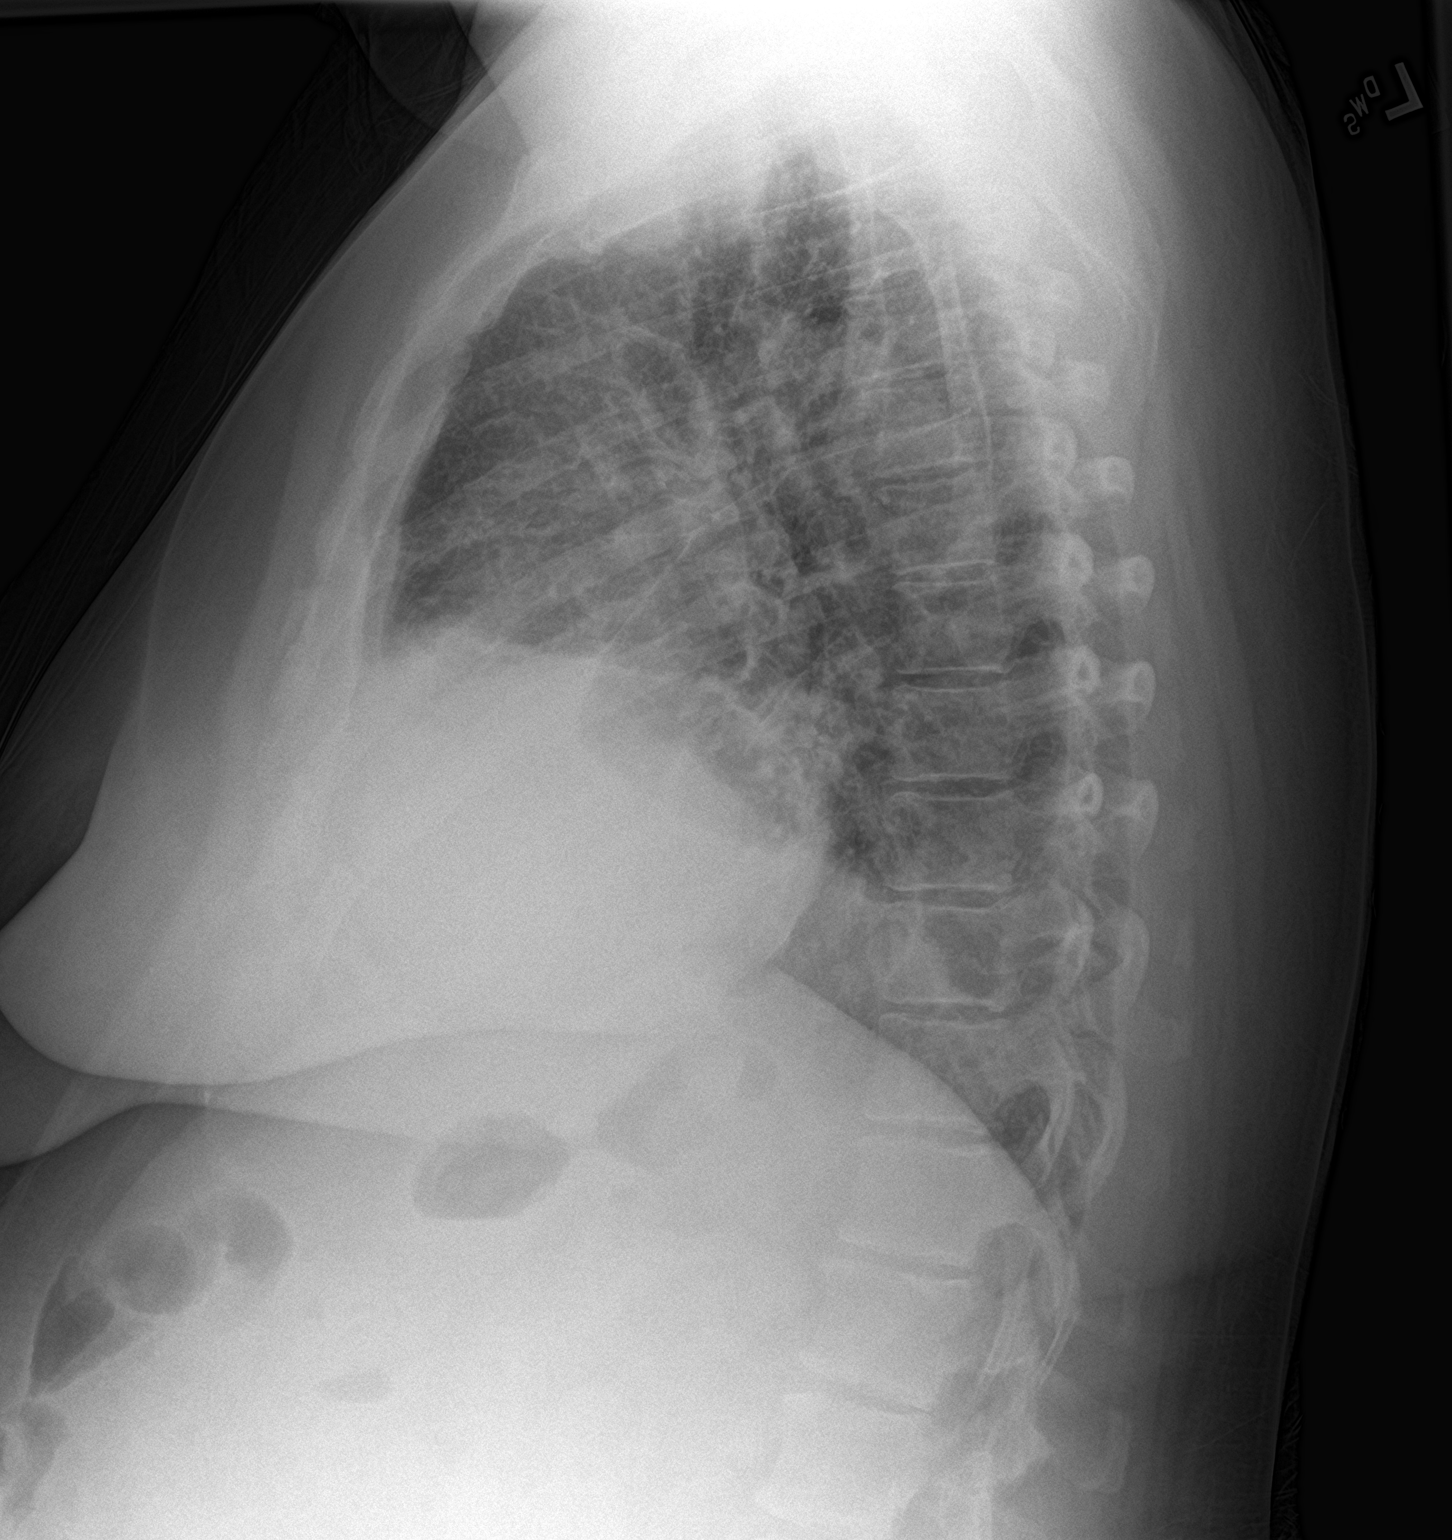

[2 of 2 positions shown; findings below may reference images not displayed]

FINDINGS: Mild to moderate cardiomegaly. Other mediastinal contours are within
normal limits. Visualized tracheal air column is within normal
limits. Mildly elevated right hemidiaphragm but superimposed veiling
right lung base opacity compatible with small to moderate right
pleural effusion. Diffuse pulmonary vascular congestion. Streaky
probable right lung base atelectasis. No pneumothorax. No left
effusion.

No osseous abnormality identified.  Negative visible bowel gas.
IMPRESSION: Mild or moderate cardiomegaly with pulmonary interstitial edema and
right pleural effusion.

## 2023-04-13 IMAGING — US US EXTREM LOW VENOUS
1 series · 13 of 24 positions shown · non-contrast
Comparison: None.

CLINICAL DATA: 45-year-old female with bilateral leg swelling,
ulcerations, erythema.



[Series 1: us venous img lower bilat (dvt) · portal-venous · 13 of 56 slices shown]
[im 1/56]
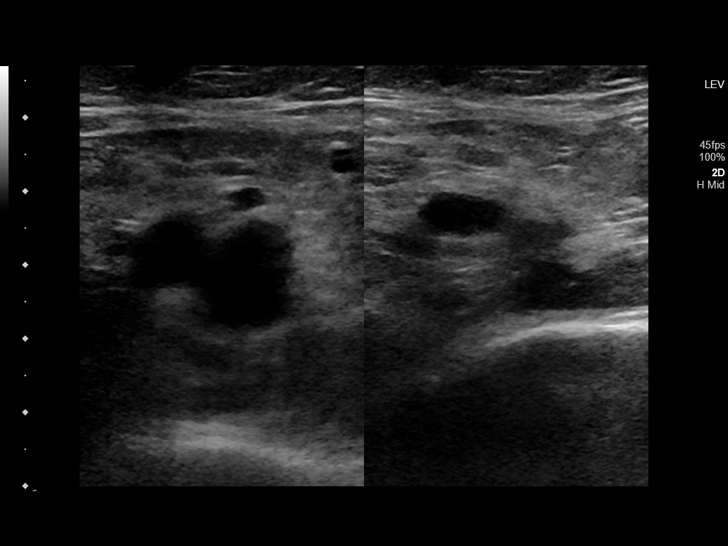
[im 5/56]
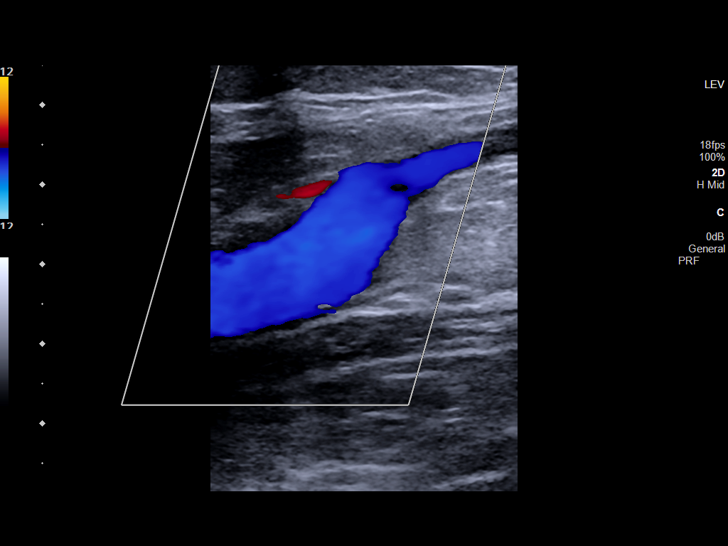
[im 10/56]
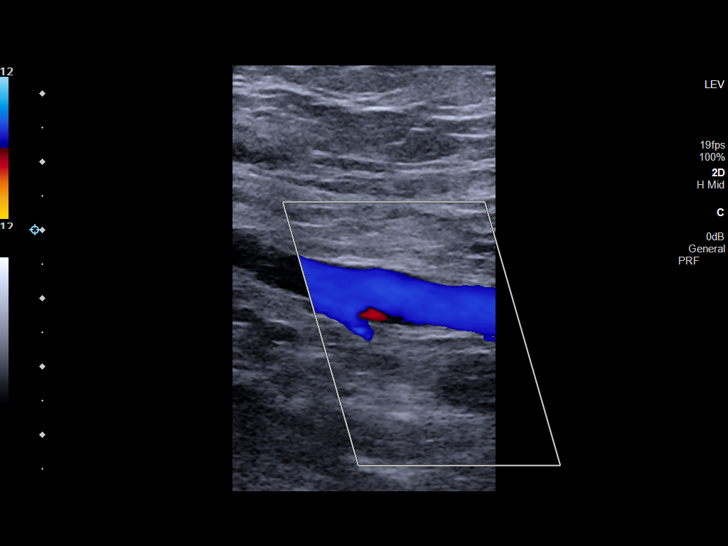
[im 15/56]
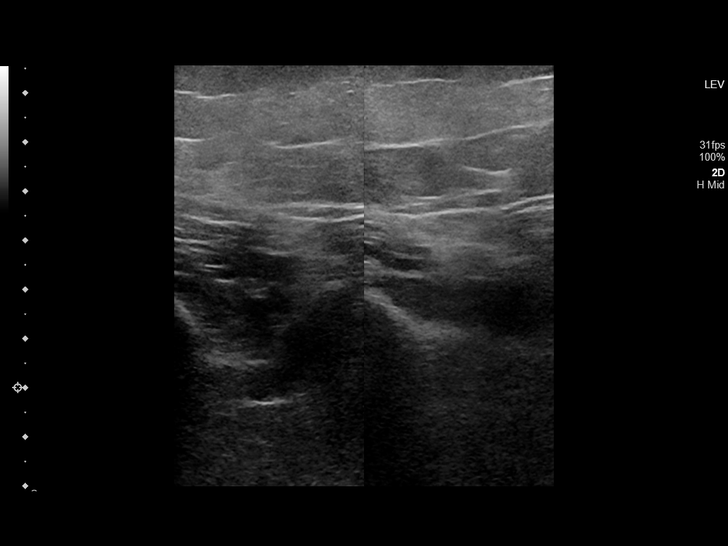
[im 20/56]
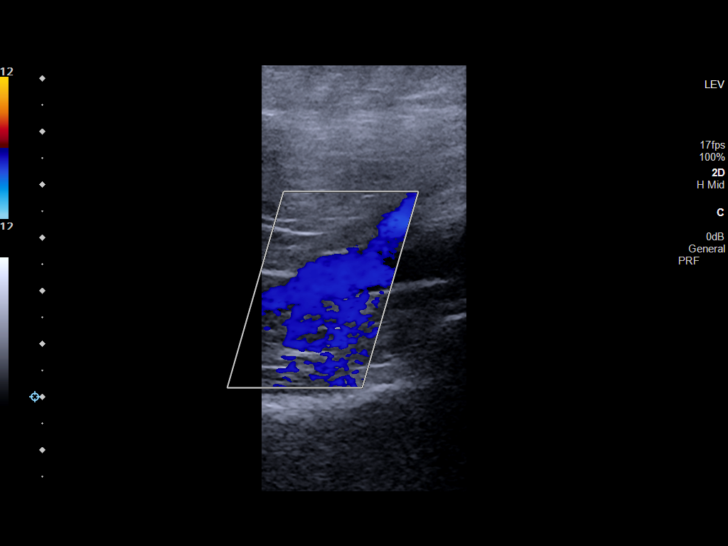
[im 24/56]
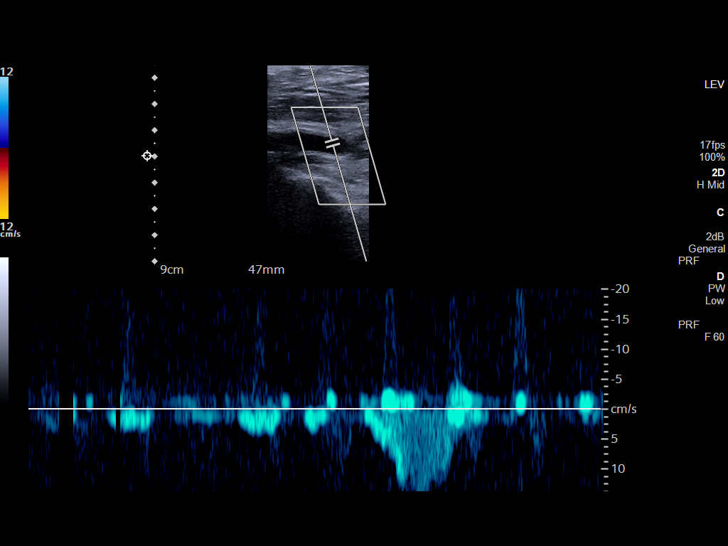
[im 29/56]
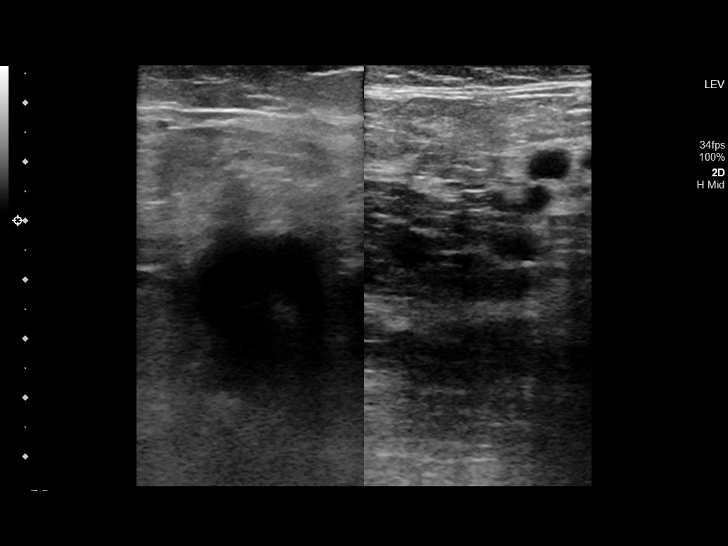
[im 32/56]
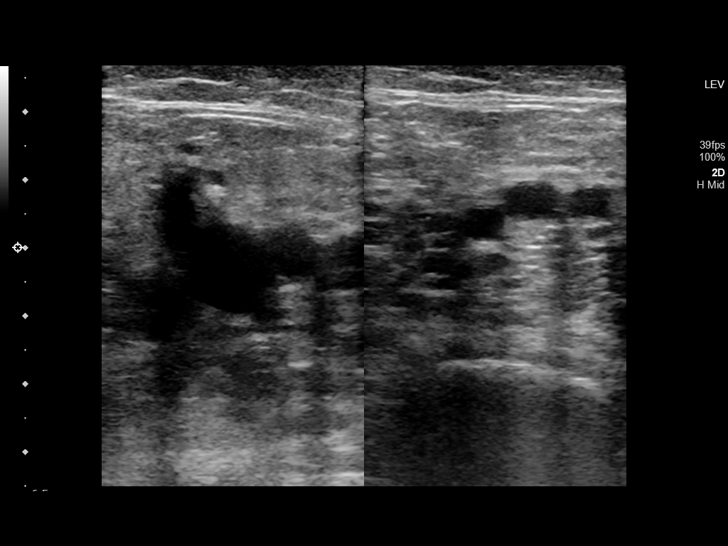
[im 36/56]
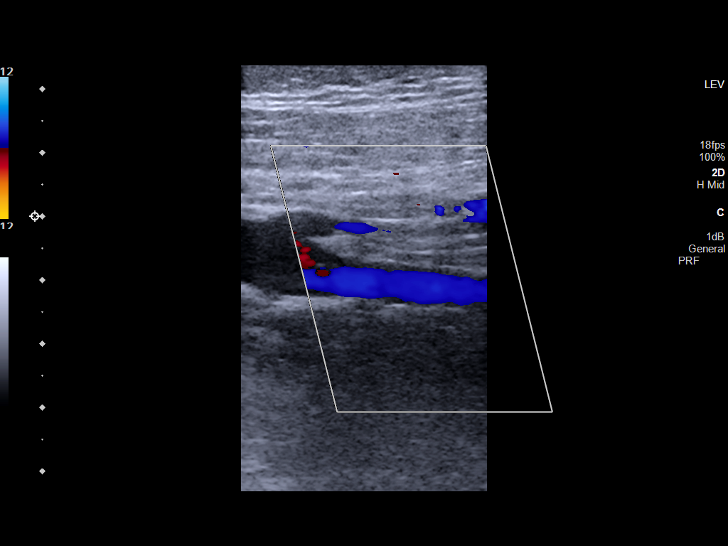
[im 41/56]
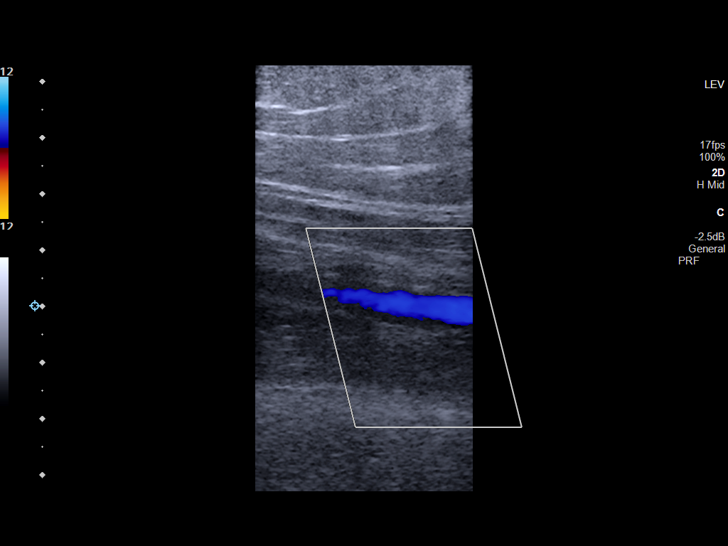
[im 46/56]
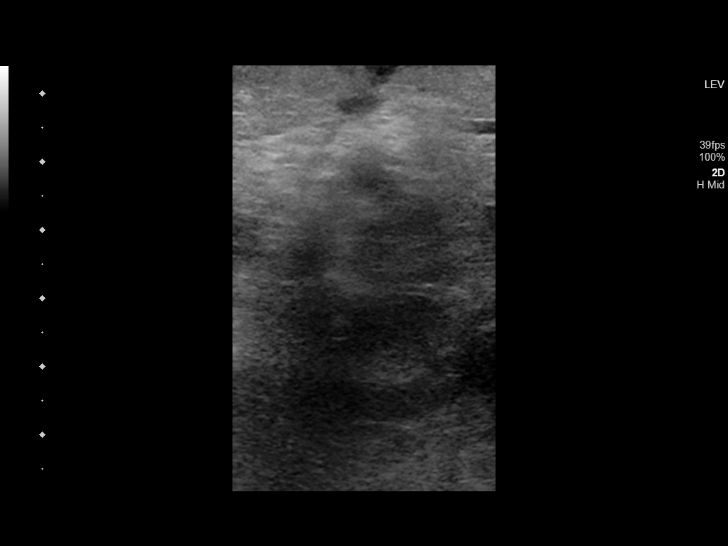
[im 51/56]
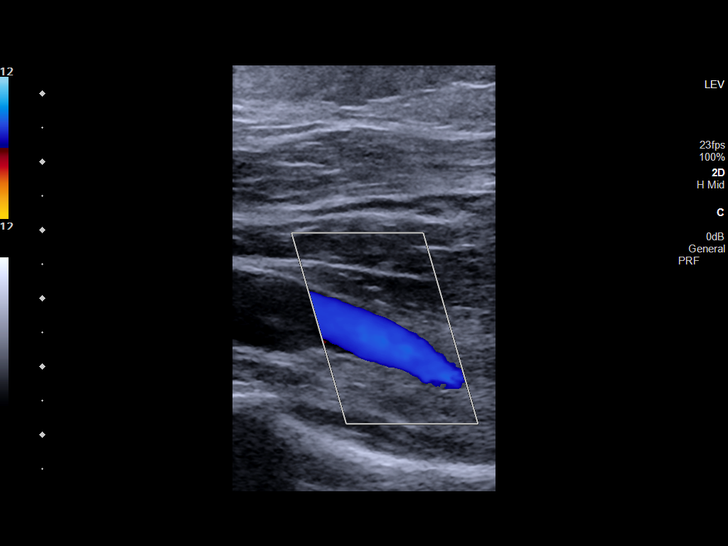
[im 56/56]
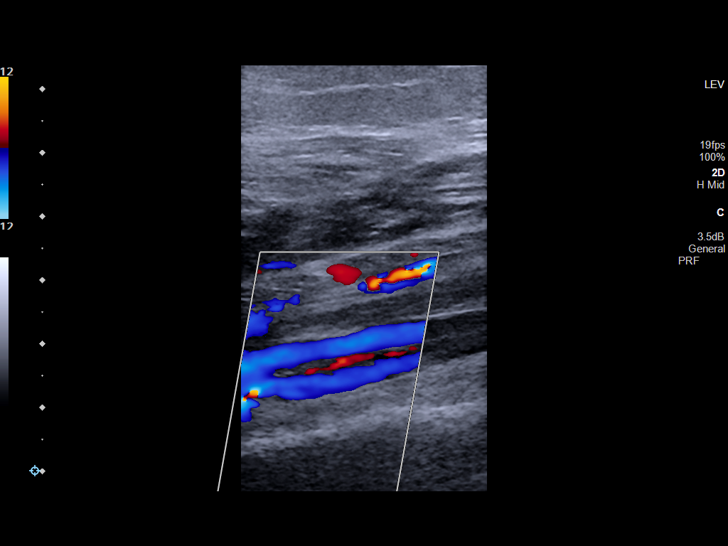

[13 of 24 positions shown; findings below may reference images not displayed]

FINDINGS: RIGHT LOWER EXTREMITY

Common Femoral Vein: No evidence of thrombus. Normal
compressibility, respiratory phasicity and response to augmentation.

Saphenofemoral Junction: No evidence of thrombus. Normal
compressibility and flow on color Doppler imaging.

Profunda Femoral Vein: No evidence of thrombus. Normal
compressibility and flow on color Doppler imaging.

Femoral Vein: No evidence of thrombus. Normal compressibility,
respiratory phasicity and response to augmentation.

Popliteal Vein: No evidence of thrombus. Normal compressibility,
respiratory phasicity and response to augmentation.

Calf Veins: No evidence of thrombus. Normal compressibility and flow
on color Doppler imaging.

Other Findings:  None.

LEFT LOWER EXTREMITY

Common Femoral Vein: No evidence of thrombus. Normal
compressibility, respiratory phasicity and response to augmentation.

Saphenofemoral Junction: No evidence of thrombus. Normal
compressibility and flow on color Doppler imaging.

Profunda Femoral Vein: No evidence of thrombus. Normal
compressibility and flow on color Doppler imaging.

Femoral Vein: No evidence of thrombus. Normal compressibility,
respiratory phasicity and response to augmentation.

Popliteal Vein: No evidence of thrombus. Normal compressibility,
respiratory phasicity and response to augmentation.

Calf Veins: No evidence of thrombus. Normal compressibility and flow
on color Doppler imaging.

Other Findings:  None.
IMPRESSION: No evidence of bilateral lower extremity deep venous thrombosis.

## 2023-04-14 IMAGING — US US ABDOMEN COMPLETE
1 series · 13 of 25 positions shown · non-contrast
Comparison: None.

CLINICAL DATA: Abdominal tenderness for 1 day.

EXAM:
ABDOMEN ULTRASOUND COMPLETE

[Series 1: us abdomen complete · 13 of 161 slices shown]
[im 1/161]
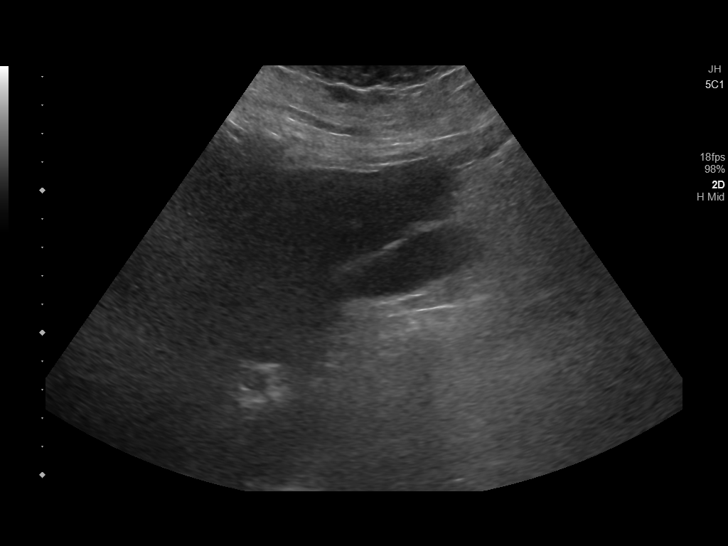
[im 14/161]
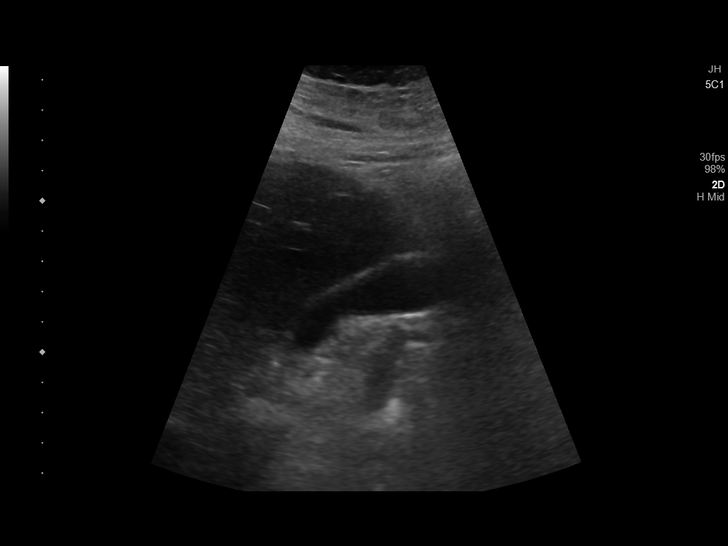
[im 27/161]
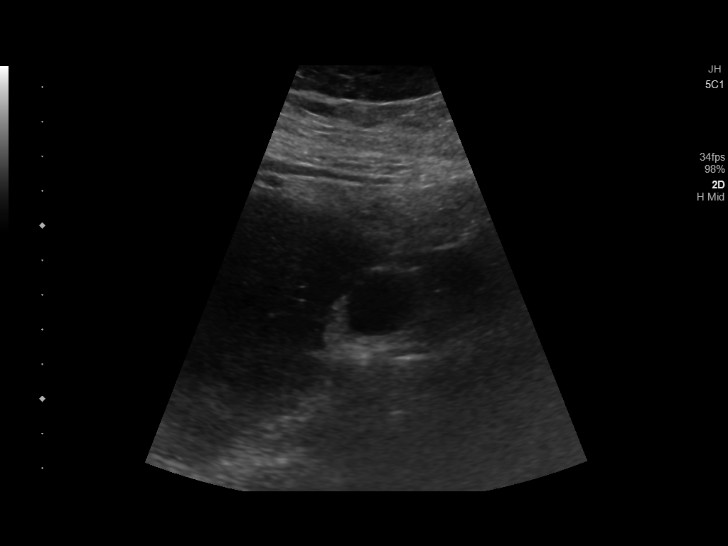
[im 41/161]
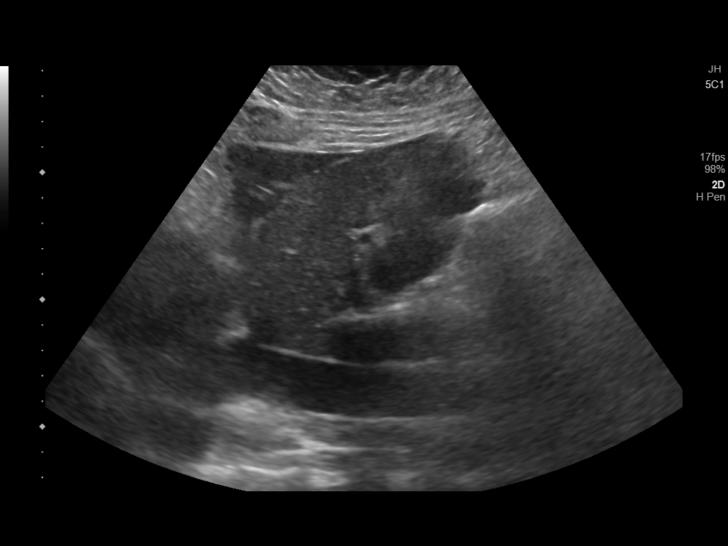
[im 54/161]
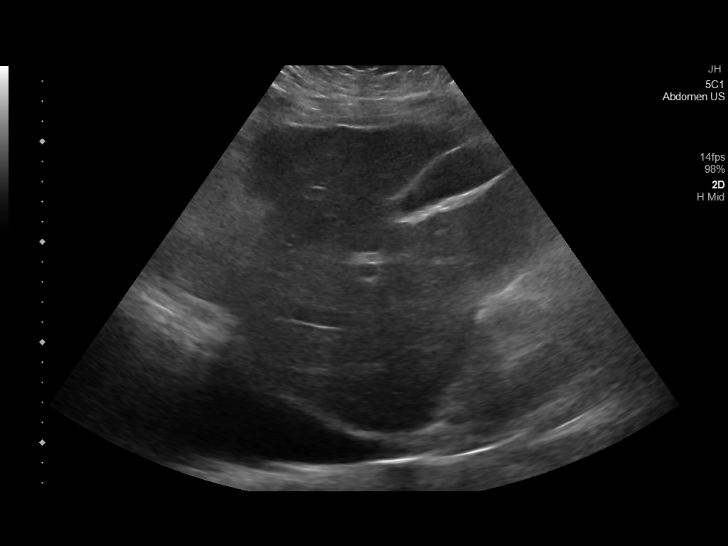
[im 67/161]
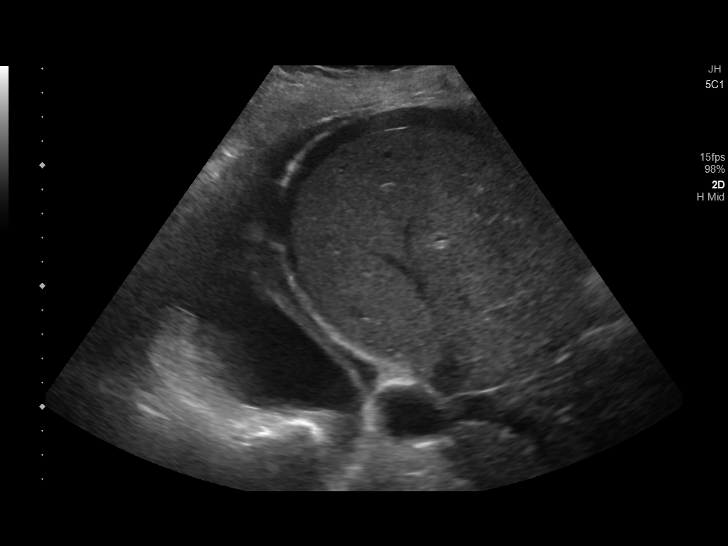
[im 81/161]
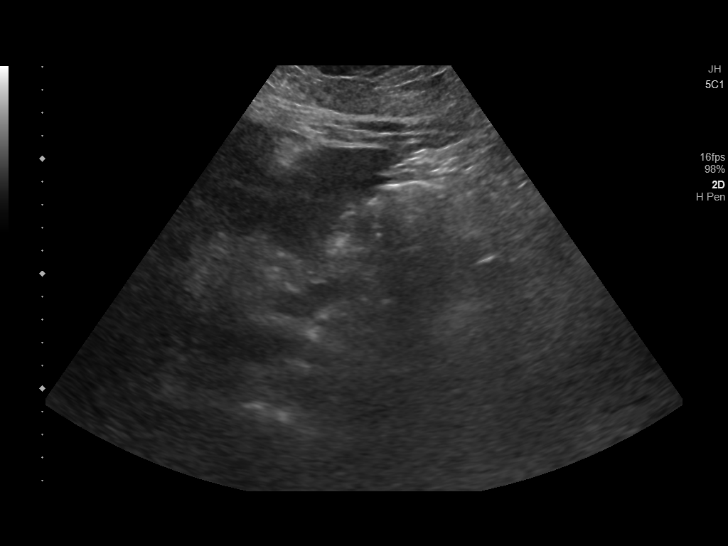
[im 94/161]
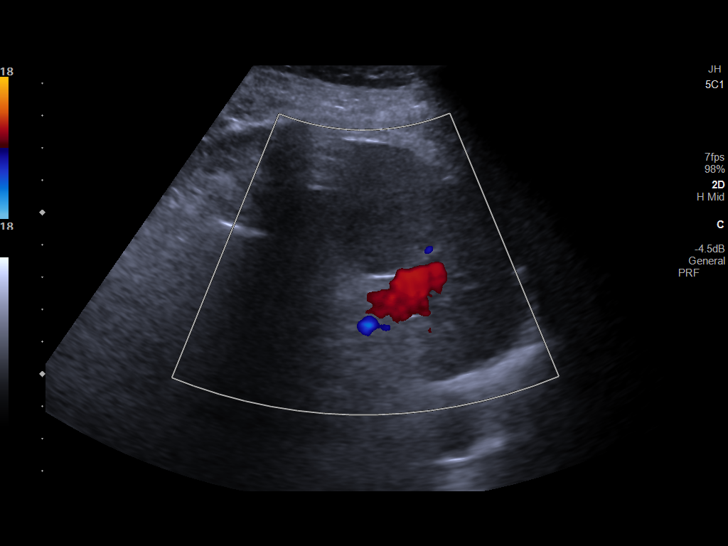
[im 107/161]
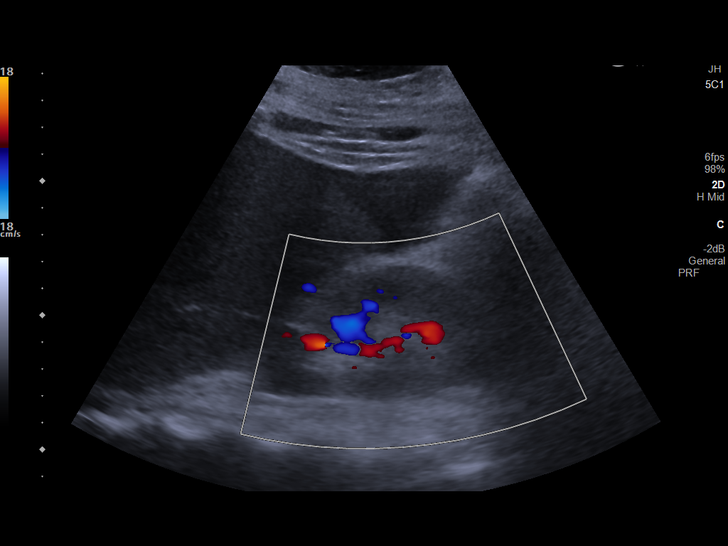
[im 121/161]
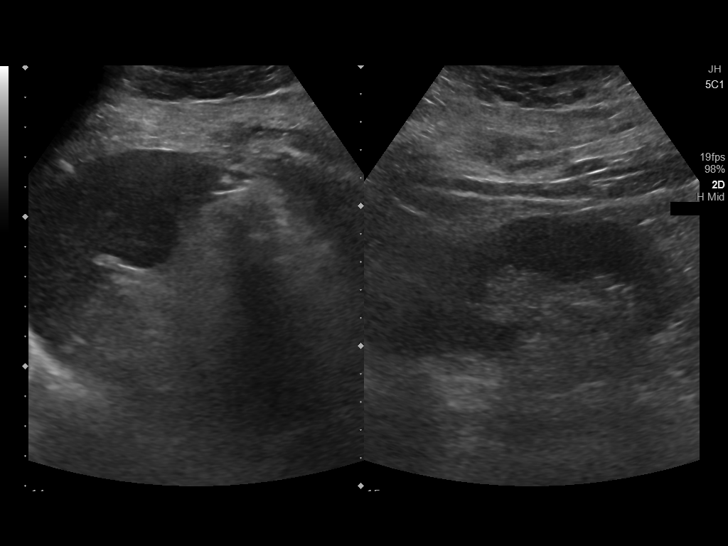
[im 134/161]
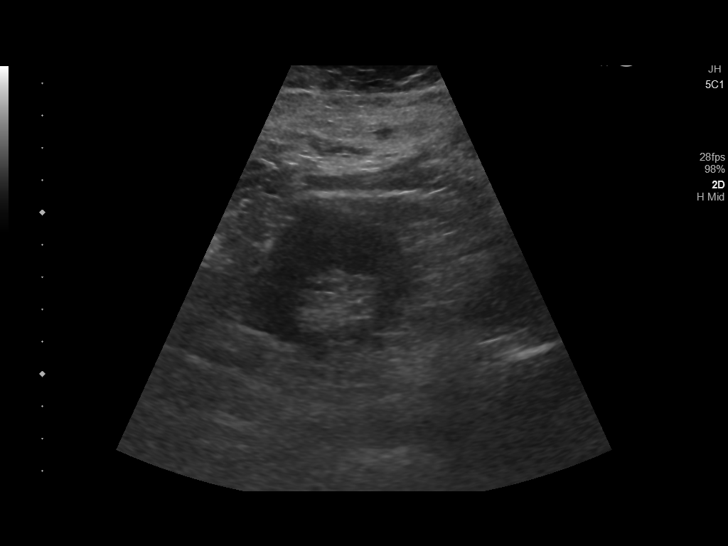
[im 147/161]
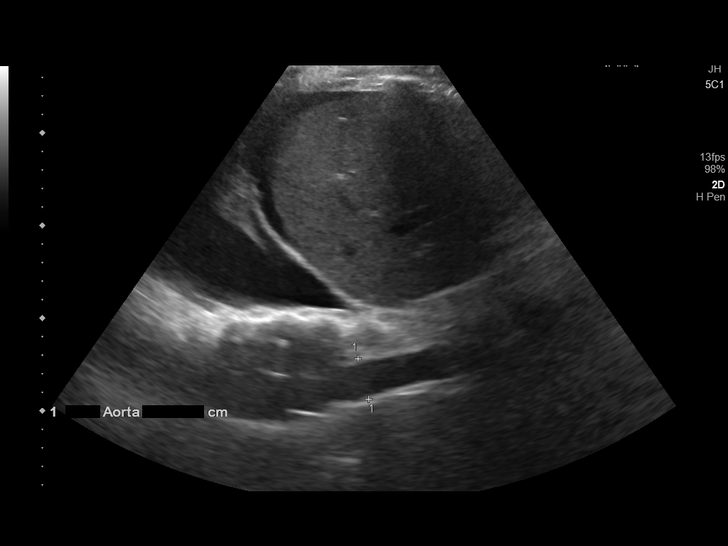
[im 161/161]
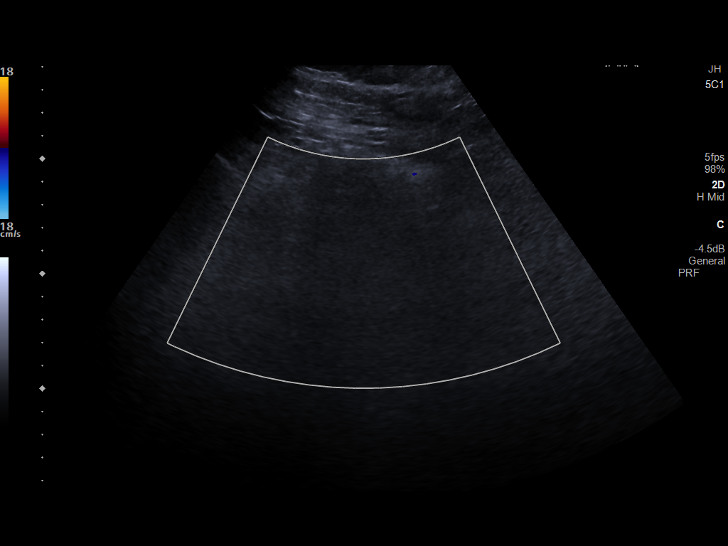

[13 of 25 positions shown; findings below may reference images not displayed]

FINDINGS: Gallbladder: No gallbladder wall thickening visualized. Question
mm calculus in the dependent portion of the gallbladder. No
sonographic Murphy sign noted by sonographer.

Common bile duct: Diameter: 4.2 mm

Liver: No focal lesion identified. Diffusely increased parenchymal
echogenicity. Portal vein is patent on color Doppler imaging with
normal direction of blood flow towards the liver.

IVC: No abnormality visualized.

Pancreas: Obscured by gas.

Spleen: Size and appearance within normal limits.

Right Kidney: Length: 12.1 cm. Echogenicity within normal limits. No
mass or hydronephrosis visualized.

Left Kidney: Length: 11.3 cm. Echogenicity within normal limits. No
mass or hydronephrosis visualized.

Abdominal aorta: No aneurysm visualized.

Other findings: Small to moderate ascites, most noticeable in
perihepatic location.
IMPRESSION: Small to moderate abdominopelvic ascites.

Questionable cholelithiasis without sonographic evidence of
cholecystitis.

Diffusely increased echogenicity of the liver usually associated
with intrinsic liver disease.

## 2023-04-18 DIAGNOSIS — H5213 Myopia, bilateral: Secondary | ICD-10-CM | POA: Diagnosis not present

## 2023-04-20 DIAGNOSIS — Z419 Encounter for procedure for purposes other than remedying health state, unspecified: Secondary | ICD-10-CM | POA: Diagnosis not present

## 2023-05-02 DIAGNOSIS — R768 Other specified abnormal immunological findings in serum: Secondary | ICD-10-CM | POA: Diagnosis not present

## 2023-05-02 DIAGNOSIS — Z87898 Personal history of other specified conditions: Secondary | ICD-10-CM | POA: Diagnosis not present

## 2023-05-02 DIAGNOSIS — Z9289 Personal history of other medical treatment: Secondary | ICD-10-CM | POA: Diagnosis not present

## 2023-05-07 DIAGNOSIS — R768 Other specified abnormal immunological findings in serum: Secondary | ICD-10-CM | POA: Diagnosis not present

## 2023-05-07 DIAGNOSIS — Z9289 Personal history of other medical treatment: Secondary | ICD-10-CM | POA: Diagnosis not present

## 2023-05-20 DIAGNOSIS — Z419 Encounter for procedure for purposes other than remedying health state, unspecified: Secondary | ICD-10-CM | POA: Diagnosis not present

## 2023-06-20 ENCOUNTER — Other Ambulatory Visit: Payer: Self-pay

## 2023-06-20 DIAGNOSIS — Z419 Encounter for procedure for purposes other than remedying health state, unspecified: Secondary | ICD-10-CM | POA: Diagnosis not present

## 2023-07-04 ENCOUNTER — Other Ambulatory Visit: Payer: Self-pay

## 2023-07-04 DIAGNOSIS — M2550 Pain in unspecified joint: Secondary | ICD-10-CM | POA: Diagnosis not present

## 2023-07-04 DIAGNOSIS — R768 Other specified abnormal immunological findings in serum: Secondary | ICD-10-CM | POA: Diagnosis not present

## 2023-07-04 DIAGNOSIS — M791 Myalgia, unspecified site: Secondary | ICD-10-CM | POA: Diagnosis not present

## 2023-07-04 MED ORDER — METHOCARBAMOL 500 MG PO TABS
500.0000 mg | ORAL_TABLET | Freq: Three times a day (TID) | ORAL | 2 refills | Status: AC | PRN
  Filled 2023-07-04: qty 90, 30d supply, fill #0
  Filled 2024-04-06: qty 90, 30d supply, fill #1

## 2023-07-09 ENCOUNTER — Telehealth: Payer: Self-pay | Admitting: Internal Medicine

## 2023-07-09 NOTE — Telephone Encounter (Signed)
Copied from CRM [id number]. Topic: Referral - Request for Referral >> Jul 09, 2023  1:40 PM Ja-Kwan M wrote: Has patient seen PCP for this complaint? Yes.   *If NO, is insurance requiring patient see PCP for this issue before PCP can refer them? Referral for which specialty: Dermatology Preferred provider/office: no specific location just a location that accepts Medicaid  Reason for referral:

## 2023-07-10 NOTE — Telephone Encounter (Signed)
Pt has only had one visit with PCP no documentation on why needing a dermatology referral. Pt will need an appt to discuss further

## 2023-07-10 NOTE — Telephone Encounter (Signed)
Tried to call pt to get her on the schedule and MB is full

## 2023-07-21 DIAGNOSIS — Z419 Encounter for procedure for purposes other than remedying health state, unspecified: Secondary | ICD-10-CM | POA: Diagnosis not present

## 2023-08-02 ENCOUNTER — Telehealth: Payer: Self-pay | Admitting: Internal Medicine

## 2023-08-02 NOTE — Telephone Encounter (Signed)
Called patient due to confusion of note, she states last Dr. Rochele Pages her ot of work and told her to do disability for PTSD.  I told patient Dr. Caralee Ates does not do disability.  As for the letter for food stamps there is no documentation from new pt visst for her to write letter so if needing will need to schedule an appointment.

## 2023-08-02 NOTE — Telephone Encounter (Signed)
Patient called sttd she needs Dr Caralee Ates to contact Dr Lanora Manis at the [facility] [phone] to get the info that she is unable to return to work due to PTSD and other mental health issues. Once she is approves she then needs Janne Lab contacted so he will get her food stamps recertified. Patient request a call back from provider to ensure the message was relayed.

## 2023-08-19 NOTE — Progress Notes (Unsigned)
Established Patient Office Visit  Subjective    Patient ID: Cynthia Hodge, female    DOB: [birth year]  Age: 47 y.o. MRN: [id number]  CC:  No chief complaint on file.   HPI Cynthia Hodge presents to follow up on chronic medical conditions ? Was hospitalized in December 2022 after accidentally being immersed in [place]. She was admitted for 10 days per the patient, diagnosed with sepsis and abdominal swelling.   Hypertension: -Medications: Lisinopril 20 mg, Metoprolol 50 mg BID -Patient is compliant with above medications and reports no side effects. -Checking BP at home (average): doesn't check regularly  -Denies any SOB, CP, vision changes, LE edema or symptoms of hypotension  HLD: -Medications: Nothing  -Last lipid panel: Lipid Panel     Component Value Date/Time   CHOL 203 (H) 10/17/2022 1215   TRIG 179 (H) 10/17/2022 1215   HDL 34 (L) 10/17/2022 1215   CHOLHDL 6.0 (H) 10/17/2022 1215   LDLCALC 137 (H) 10/17/2022 1215   LABVLDL 32 10/17/2022 1215   The 10-year ASCVD risk score (Arnett DK, et al., 2019) is: 4.8%   Values used to calculate the score:     Age: 63 years     Sex: Female     Is Non-Hispanic African American: No     Diabetic: No     Tobacco smoker: No     Systolic Blood Pressure: 164 mmHg     Is BP treated: Yes     HDL Cholesterol: 34 mg/dL     Total Cholesterol: 203 mg/dL  Pre-Diabetes: -Last W0J 6/23 5.8%, was 6.3% 1 year ago -Not on any medication currently  History of positive ANA: -Positive in 11/23 -Had been following with Rheumatology   Recurrent Yeast Infections: -Does have current vaginal itching and change in vaginal discharge  Health Maintenance: -Blood work UTD -Pap due -Colon cancer screening due  Outpatient Encounter Medications as of 08/20/2023  Medication Sig   Blood Pressure KIT USE AS DIRECTED DAILY.   lisinopril (ZESTRIL) 20 MG tablet Take 1 tablet (20 mg total) by mouth once daily.   methocarbamol (ROBAXIN) 500 MG  tablet Take 1 tablet (500 mg total) by mouth 3 (three) times daily as needed.   metoprolol tartrate (LOPRESSOR) 50 MG tablet Take 1 tablet (50 mg total) by mouth 2 (two) times daily.   No facility-administered encounter medications on file as of 08/20/2023.    Past Medical History:  Diagnosis Date   Hypertension     Past Surgical History:  Procedure Laterality Date   COLONOSCOPY WITH PROPOFOL N/A 04/09/2023   Procedure: COLONOSCOPY WITH PROPOFOL;  Surgeon: Toney Reil, MD;  Location[facility] ENDOSCOPY;  Service: Gastroenterology;  Laterality: N/A;   NO PAST SURGERIES      Family History  Problem Relation Age of Onset   Stroke Mother    Dementia Mother    Hypertension Father    Hypertension Sister    Diabetes Paternal Uncle    Other Maternal Grandmother        childbirth   Other Maternal Grandfather        carbon monoxide posioning   Cervical cancer Paternal Grandmother    Diabetes Paternal Grandfather     Social History   Socioeconomic History   Marital status: Single    Spouse name: Not on file   Number of children: Not on file   Years of education: Not on file   Highest education level: Not on file  Occupational History  Not on file  Tobacco Use   Smoking status: Never   Smokeless tobacco: Never  Vaping Use   Vaping status: Never Used  Substance and Sexual Activity   Alcohol use: Not Currently   Drug use: Never   Sexual activity: Not Currently  Other Topics Concern   Not on file  Social History Narrative   Not on file   Social Determinants of Health   Financial Resource Strain: Not on file  Food Insecurity: No Food Insecurity (11/23/2021)   Hunger Vital Sign    Worried About Running Out of Food in the Last Year: Never true    Ran Out of Food in the Last Year: Never true  Transportation Needs: No Transportation Needs (11/23/2021)   PRAPARE - Administrator, Civil Service (Medical): No    Lack of Transportation (Non-Medical): No  Physical  Activity: Not on file  Stress: Not on file  Social Connections: Not on file  Intimate Partner Violence: Not on file    Review of Systems  Constitutional:  Negative for chills and fever.  Respiratory:  Negative for shortness of breath.   Cardiovascular:  Negative for chest pain.        Objective    There were no vitals taken for this visit.  Physical Exam Constitutional:      Appearance: Normal appearance.  HENT:     Head: Normocephalic and atraumatic.     Mouth/Throat:     Mouth: Mucous membranes are moist.     Pharynx: Oropharynx is clear.  Eyes:     Conjunctiva/sclera: Conjunctivae normal.  Cardiovascular:     Rate and Rhythm: Normal rate and regular rhythm.  Pulmonary:     Effort: Pulmonary effort is normal.     Breath sounds: Normal breath sounds.  Musculoskeletal:     Right lower leg: No edema.     Left lower leg: No edema.  Skin:    General: Skin is warm and dry.  Neurological:     General: No focal deficit present.     Mental Status: She is alert. Mental status is at baseline.  Psychiatric:        Mood and Affect: Mood normal.        Behavior: Behavior normal.         Assessment & Plan:   1. Essential hypertension: Has been taking blood pressure medication since her hospitalization in December 2022.  Blood pressure is at goal here today.  Continue lisinopril 20 mg and metoprolol 50 mg twice daily, refilled.  - lisinopril (ZESTRIL) 20 MG tablet; Take 1 tablet (20 mg total) by mouth once daily.  Dispense: 90 tablet; Refill: 1 - metoprolol tartrate (LOPRESSOR) 50 MG tablet; Take 1 tablet (50 mg total) by mouth 2 (two) times daily.  Dispense: 180 tablet; Refill: 1  2. Moderate mixed hyperlipidemia not requiring statin therapy: Reviewed lipid panel with the patient from November 2023, LDL was elevated at the time.  ASCVD risk considered low at 2.5%.  Discussed decreasing red meats and fatty processed foods in the patient's diet with plans to recheck at  follow-up.  3. Prediabetes: A1c here today with prediabetes at 5.9%.  Patient counseled about lifestyle modifications.  - POCT HgB A1C  4. Vaginal itching: Vaginal swab obtained today, will prophylactically treat with Diflucan.  - Cervicovaginal ancillary only - fluconazole (DIFLUCAN) 150 MG tablet; Take 1 tablet (150 mg total) by mouth once for 1 dose.  Dispense: 3 tablet; Refill: 0  5.  Positive ANA (antinuclear antibody): Referral to rheumatology placed for history of positive ANAs.  - Ambulatory referral to Rheumatology  6. Screening for colon cancer: Referral placed to GI for colon cancer screening.  - Ambulatory referral to Gastroenterology   No follow-ups on file.   Margarita Mail, DO

## 2023-08-20 ENCOUNTER — Ambulatory Visit: Payer: Medicaid Other | Admitting: Internal Medicine

## 2023-08-20 ENCOUNTER — Encounter: Payer: Self-pay | Admitting: Internal Medicine

## 2023-08-20 VITALS — BP 134/72 | HR 92 | Temp 97.8°F | Resp 18 | Ht 65.0 in | Wt 247.9 lb

## 2023-08-20 DIAGNOSIS — F4310 Post-traumatic stress disorder, unspecified: Secondary | ICD-10-CM | POA: Diagnosis not present

## 2023-08-20 DIAGNOSIS — Z419 Encounter for procedure for purposes other than remedying health state, unspecified: Secondary | ICD-10-CM | POA: Diagnosis not present

## 2023-09-14 ENCOUNTER — Other Ambulatory Visit: Payer: Self-pay | Admitting: Internal Medicine

## 2023-09-14 DIAGNOSIS — I10 Essential (primary) hypertension: Secondary | ICD-10-CM

## 2023-09-16 ENCOUNTER — Other Ambulatory Visit: Payer: Self-pay | Admitting: Internal Medicine

## 2023-09-16 ENCOUNTER — Other Ambulatory Visit: Payer: Self-pay

## 2023-09-16 DIAGNOSIS — I10 Essential (primary) hypertension: Secondary | ICD-10-CM

## 2023-09-17 ENCOUNTER — Other Ambulatory Visit: Payer: Self-pay

## 2023-09-17 MED FILL — Metoprolol Tartrate Tab 50 MG: ORAL | 90 days supply | Qty: 180 | Fill #0 | Status: AC

## 2023-09-17 MED FILL — Lisinopril Tab 20 MG: ORAL | 90 days supply | Qty: 90 | Fill #0 | Status: AC

## 2023-09-17 NOTE — Telephone Encounter (Signed)
Requested medication (s) are due for refill today:   Yes for both  Requested medication (s) are on the active medication list:   Yes for both  Future visit scheduled:   Yes in 1 month   LOV 03/15/2023 when she established care   Last ordered: Both ordered 03/15/2023 for 90 days with 1 refill each.  Returned because labs are due.   There is a note to refill for 1 month because she needs to  request  future refills with her new PCP.    Not sure when this note was written.  Requested Prescriptions  Pending Prescriptions Disp Refills   lisinopril (ZESTRIL) 20 MG tablet 90 tablet 1    Sig: Take 1 tablet (20 mg total) by mouth once daily.     Cardiovascular:  ACE Inhibitors Failed - 09/16/2023  7:54 AM      Failed - Cr in normal range and within 180 days    Creatinine, Ser  Date Value Ref Range Status  05/15/2022 0.84 0.57 - 1.00 mg/dL Final   Creatinine, Urine  Date Value Ref Range Status  10/24/2021 52 mg/dL Final         Failed - K in normal range and within 180 days    Potassium  Date Value Ref Range Status  05/15/2022 4.9 3.5 - 5.2 mmol/L Final         Passed - Patient is not pregnant      Passed - Last BP in normal range    BP Readings from Last 1 Encounters:  08/20/23 134/72         Passed - Valid encounter within last 6 months    Recent Outpatient Visits           4 weeks ago PTSD (post-traumatic stress disorder)   [facility] Margarita Mail, DO   6 months ago Essential hypertension   [facility] Margarita Mail, DO       Future Appointments             In 1 month Margarita Mail, DO [facility]             metoprolol tartrate (LOPRESSOR) 50 MG tablet 180 tablet 1    Sig: Take 1 tablet (50 mg total) by mouth 2 (two) times daily.     Cardiovascular:  Beta Blockers Passed - 09/16/2023  7:54 AM      Passed - Last BP in normal range    BP Readings from Last 1  Encounters:  08/20/23 134/72         Passed - Last Heart Rate in normal range    Pulse Readings from Last 1 Encounters:  08/20/23 92         Passed - Valid encounter within last 6 months    Recent Outpatient Visits           4 weeks ago PTSD (post-traumatic stress disorder)   [facility] Margarita Mail, DO   6 months ago Essential hypertension   [facility] Margarita Mail, DO       Future Appointments             In 1 month Margarita Mail, DO [facility]

## 2023-09-20 DIAGNOSIS — Z419 Encounter for procedure for purposes other than remedying health state, unspecified: Secondary | ICD-10-CM | POA: Diagnosis not present

## 2023-10-15 NOTE — Progress Notes (Signed)
Name: Cynthia Hodge   MRN: [id number]    DOB: 1975-11-26   Date:10/21/2023       Progress Note  Subjective  Chief Complaint  Chief Complaint  Patient presents with   Annual Exam    HPI  Patient presents for annual CPE.  Diet: Regular but watching sodium intake Exercise: 3 days a week 30 minutes  Last Eye Exam: 06/09/2023 Last Dental Exam: 08/07/2023  Flowsheet Row Office Visit from 10/21/2023 in [facility]  AUDIT-C Score 0      Depression: Phq 9 is  negative    10/21/2023    1:44 PM 08/20/2023   11:42 AM 12/12/2022    3:20 PM 12/06/2022    2:07 PM 11/29/2022    4:17 PM  Depression screen PHQ 2/9  Decreased Interest 0 1 2 2 2   Down, Depressed, Hopeless 0 0 2 2 2   PHQ - 2 Score 0 1 4 4 4   Altered sleeping  1 2 1 2   Tired, decreased energy  2 2 1 2   Change in appetite  1 2 3 1   Feeling bad or failure about yourself   1 0 0 0  Trouble concentrating  1 3 3 3   Moving slowly or fidgety/restless  0 0 0 0  Suicidal thoughts  0 0 0 0  PHQ-9 Score  7 13 12 12   Difficult doing work/chores  Somewhat difficult Very difficult Somewhat difficult Somewhat difficult   Hypertension: BP Readings from Last 3 Encounters:  10/21/23 132/84  08/20/23 134/72  04/09/23 118/89   Obesity: Wt Readings from Last 3 Encounters:  10/21/23 246 lb 6.4 oz (111.8 kg)  08/20/23 247 lb 14.4 oz (112.4 kg)  04/09/23 236 lb 7.8 oz (107.3 kg)   BMI Readings from Last 3 Encounters:  10/21/23 41.00 kg/m  08/20/23 41.25 kg/m  04/09/23 39.35 kg/m     Vaccines:   HPV: N/a Tdap: Due Shingrix: N/a Pneumonia: N/a Flu: declined COVID-19: declined   Hep C Screening: complete STD testing and prevention (HIV/chl/gon/syphilis): HIV complete Intimate partner violence: negative screen  LMP: 09/20/23, regular, light. Not on contraception, not currently sexually active  Discussed importance of follow up if any post-menopausal bleeding: not applicable  Incontinence  Symptoms: negative for symptoms   Breast cancer:  - Last Mammogram: due/ordered  Osteoporosis Prevention : Discussed high calcium and vitamin D supplementation, weight bearing exercises Bone density :not applicable   Cervical cancer screening: due /ordered  Skin cancer: Discussed monitoring for atypical lesions  Colorectal cancer: 04/09/23, repeat in 10 years Lung cancer:  Low Dose CT Chest recommended if Age 35-80 years, 20 pack-year currently smoking OR have quit w/in 15years. Patient does not qualify for screen   ECG: 10/20/21  Advanced Care Planning: A voluntary discussion about advance care planning including the explanation and discussion of advance directives.  Discussed health care proxy and Living will, and the patient was able to identify a health care proxy as Cynthia Hodge (dad).  Patient does not have a living will and power of attorney of health care   Lipids: Lab Results  Component Value Date   CHOL 203 (H) 10/17/2022   CHOL 209 (H) 05/15/2022   Lab Results  Component Value Date   HDL 34 (L) 10/17/2022   HDL 38 (L) 05/15/2022   Lab Results  Component Value Date   LDLCALC 137 (H) 10/17/2022   LDLCALC 138 (H) 05/15/2022   Lab Results  Component Value Date   TRIG  179 (H) 10/17/2022   TRIG 180 (H) 05/15/2022   Lab Results  Component Value Date   CHOLHDL 6.0 (H) 10/17/2022   CHOLHDL 5.5 (H) 05/15/2022   No results found for: "LDLDIRECT"  Glucose: Glucose  Date Value Ref Range Status  05/15/2022 111 (H) 70 - 99 mg/dL Final  11/21/7251 82 70 - 99 mg/dL Final   Glucose, Bld  Date Value Ref Range Status  10/26/2021 118 (H) 70 - 99 mg/dL Final    Comment:    Glucose reference range applies only to samples taken after fasting for at least 8 hours.  10/25/2021 97 70 - 99 mg/dL Final    Comment:    Glucose reference range applies only to samples taken after fasting for at least 8 hours.  10/21/2021 82 70 - 99 mg/dL Final    Comment:    Glucose reference  range applies only to samples taken after fasting for at least 8 hours.    Patient Active Problem List   Diagnosis Date Noted   Encounter for screening colonoscopy 04/09/2023   Sore throat 10/16/2022   Nasal congestion 10/16/2022   Prediabetes 06/26/2022   Elevated lipids 06/26/2022   Vaginal discharge 12/14/2021   Antibiotic-induced yeast infection 11/23/2021   Encounter to establish care 11/23/2021   History of anxiety 11/23/2021   ANA positive 11/23/2021   Essential hypertension 11/23/2021   Cellulitis of left lower leg 10/20/2021   Bilateral leg edema 10/20/2021   Severe sepsis (HCC) 10/20/2021   Abnormal LFTs 10/20/2021   Hypertensive urgency 10/20/2021   Generalized anxiety disorder 10/20/2021    Past Surgical History:  Procedure Laterality Date   COLONOSCOPY WITH PROPOFOL N/A 04/09/2023   Procedure: COLONOSCOPY WITH PROPOFOL;  Surgeon: Toney Reil, MD;  Location: [facility] ENDOSCOPY;  Service: Gastroenterology;  Laterality: N/A;   NO PAST SURGERIES      Family History  Problem Relation Age of Onset   Stroke Mother    Dementia Mother    Hypertension Father    Hypertension Sister    Diabetes Paternal Uncle    Other Maternal Grandmother        childbirth   Other Maternal Grandfather        carbon monoxide posioning   Cervical cancer Paternal Grandmother    Diabetes Paternal Grandfather     Social History   Socioeconomic History   Marital status: Single    Spouse name: Not on file   Number of children: Not on file   Years of education: Not on file   Highest education level: Not on file  Occupational History   Not on file  Tobacco Use   Smoking status: Never   Smokeless tobacco: Never  Vaping Use   Vaping status: Never Used  Substance and Sexual Activity   Alcohol use: Not Currently   Drug use: Never   Sexual activity: Not Currently  Other Topics Concern   Not on file  Social History Narrative   Not on file   Social Determinants of Health    Financial Resource Strain: Low Risk  (10/21/2023)   Overall Financial Resource Strain (CARDIA)    Difficulty of Paying Living Expenses: Not hard at all  Food Insecurity: Food Insecurity Present (10/21/2023)   Hunger Vital Sign    Worried About Running Out of Food in the Last Year: Sometimes true    Ran Out of Food in the Last Year: Sometimes true  Transportation Needs: No Transportation Needs (10/21/2023)   PRAPARE - Transportation  Lack of Transportation (Medical): No    Lack of Transportation (Non-Medical): No  Physical Activity: Insufficiently Active (10/21/2023)   Exercise Vital Sign    Days of Exercise per Week: 3 days    Minutes of Exercise per Session: 30 min  Stress: No Stress Concern Present (10/21/2023)   [employer] of Occupational Health - Occupational Stress Questionnaire    Feeling of Stress : Only a little  Social Connections: Socially Isolated (10/21/2023)   Social Connection and Isolation Panel [NHANES]    Frequency of Communication with Friends and Family: More than three times a week    Frequency of Social Gatherings with Friends and Family: More than three times a week    Attends Religious Services: Never    Database administrator or Organizations: No    Attends Banker Meetings: Never    Marital Status: Never married  Intimate Partner Violence: Not At Risk (10/21/2023)   Humiliation, Afraid, Rape, and Kick questionnaire    Fear of Current or Ex-Partner: No    Emotionally Abused: No    Physically Abused: No    Sexually Abused: No     Current Outpatient Medications:    lisinopril (ZESTRIL) 20 MG tablet, Take 1 tablet (20 mg total) by mouth once daily., Disp: 90 tablet, Rfl: 1   methocarbamol (ROBAXIN) 500 MG tablet, Take 1 tablet (500 mg total) by mouth 3 (three) times daily as needed., Disp: 90 tablet, Rfl: 2   metoprolol tartrate (LOPRESSOR) 50 MG tablet, Take 1 tablet (50 mg total) by mouth 2 (two) times daily., Disp: 180 tablet, Rfl: 1    Blood Pressure KIT, USE AS DIRECTED DAILY. (Patient not taking: Reported on 10/21/2023), Disp: 1 kit, Rfl: 0  Allergies  Allergen Reactions   Abilify [Aripiprazole] Other (See Comments)    Extreme sleepiness and increased appetite     Review of Systems  All other systems reviewed and are negative.   Objective  Vitals:   10/21/23 1342  BP: 132/84  Pulse: 80  Resp: 16  Temp: 98 F (36.7 C)  TempSrc: Oral  SpO2: 98%  Weight: 246 lb 6.4 oz (111.8 kg)  Height: 5\' 5"  (1.651 m)    Body mass index is 41 kg/m.  Physical Exam Exam conducted with a chaperone present.  Constitutional:      Appearance: Normal appearance.  HENT:     Head: Normocephalic and atraumatic.  Eyes:     Conjunctiva/sclera: Conjunctivae normal.  Cardiovascular:     Rate and Rhythm: Normal rate and regular rhythm.  Pulmonary:     Effort: Pulmonary effort is normal.     Breath sounds: Normal breath sounds.  Genitourinary:    Comments: External genitalia within normal limits.  Vaginal mucosa pink, moist, normal rugae.  Nonfriable cervix without lesions or bleeding noted on speculum exam. Small amount of white discharge present. Bimanual exam revealed normal, nongravid uterus.  No cervical motion tenderness. No adnexal masses bilaterally.    Skin:    General: Skin is warm and dry.  Neurological:     General: No focal deficit present.     Mental Status: She is alert. Mental status is at baseline.  Psychiatric:        Mood and Affect: Mood normal.        Behavior: Behavior normal.      No results found for this or any previous visit (from the past 2160 hour(s)).   Fall Risk:    10/21/2023    1:43  PM 08/20/2023   11:35 AM 03/15/2023    9:58 AM  Fall Risk   Falls in the past year? 0 0 0  Number falls in past yr: 0 0 0  Injury with Fall? 0 0 0    Functional Status Survey: Is the patient deaf or have difficulty hearing?: No Does the patient have difficulty seeing, even when wearing  glasses/contacts?: No Does the patient have difficulty concentrating, remembering, or making decisions?: No Does the patient have difficulty walking or climbing stairs?: No Does the patient have difficulty dressing or bathing?: No Does the patient have difficulty doing errands alone such as visiting a doctor's office or shopping?: No   Assessment & Plan  1. Annual physical exam/Prediabetes: Physical exam completed, health maintenance reviewed and annual labs ordered.   - CBC w/Diff/Platelet - COMPLETE METABOLIC PANEL WITH GFR - Lipid Profile - HgB A1c  2. Screening for cervical cancer: Pap today.   - Cytology - PAP  3. Encounter for screening mammogram for malignant neoplasm of breast: Mammogram ordered.   - MM 3D SCREENING MAMMOGRAM BILATERAL BREAST; Future  4. Need for Tdap vaccination: Tdap administered today.   - Tdap vaccine greater than or equal to 7yo IM   -USPSTF grade A and B recommendations reviewed with patient; age-appropriate recommendations, preventive care, screening tests, etc discussed and encouraged; healthy living encouraged; see AVS for patient education given to patient -Discussed importance of 150 minutes of physical activity weekly, eat two servings of fish weekly, eat one serving of tree nuts ( cashews, pistachios, pecans, almonds.Marland Kitchen) every other day, eat 6 servings of fruit/vegetables daily and drink plenty of water and avoid sweet beverages.   -Reviewed Health Maintenance: Yes.

## 2023-10-20 DIAGNOSIS — Z419 Encounter for procedure for purposes other than remedying health state, unspecified: Secondary | ICD-10-CM | POA: Diagnosis not present

## 2023-10-21 ENCOUNTER — Encounter: Payer: Self-pay | Admitting: Internal Medicine

## 2023-10-21 ENCOUNTER — Other Ambulatory Visit: Payer: Self-pay

## 2023-10-21 ENCOUNTER — Ambulatory Visit (INDEPENDENT_AMBULATORY_CARE_PROVIDER_SITE_OTHER): Payer: Medicaid Other | Admitting: Internal Medicine

## 2023-10-21 ENCOUNTER — Other Ambulatory Visit (HOSPITAL_COMMUNITY)
Admission: RE | Admit: 2023-10-21 | Discharge: 2023-10-21 | Disposition: A | Discharge status: Home / Self Care | Payer: Medicaid Other | Source: Ambulatory Visit | Attending: Internal Medicine | Admitting: Internal Medicine

## 2023-10-21 VITALS — BP 132/84 | HR 80 | Temp 98.0°F | Resp 16 | Ht 65.0 in | Wt 246.4 lb

## 2023-10-21 DIAGNOSIS — R7303 Prediabetes: Secondary | ICD-10-CM

## 2023-10-21 DIAGNOSIS — Z0000 Encounter for general adult medical examination without abnormal findings: Secondary | ICD-10-CM | POA: Insufficient documentation

## 2023-10-21 DIAGNOSIS — Z1231 Encounter for screening mammogram for malignant neoplasm of breast: Secondary | ICD-10-CM

## 2023-10-21 DIAGNOSIS — Z01411 Encounter for gynecological examination (general) (routine) with abnormal findings: Secondary | ICD-10-CM

## 2023-10-21 DIAGNOSIS — Z124 Encounter for screening for malignant neoplasm of cervix: Secondary | ICD-10-CM

## 2023-10-21 DIAGNOSIS — Z23 Encounter for immunization: Secondary | ICD-10-CM

## 2023-10-23 LAB — CYTOLOGY - PAP
Adequacy: ABSENT
Comment: NEGATIVE
Diagnosis: NEGATIVE
High risk HPV: NEGATIVE

## 2023-10-28 DIAGNOSIS — Z0000 Encounter for general adult medical examination without abnormal findings: Secondary | ICD-10-CM | POA: Diagnosis not present

## 2023-10-28 DIAGNOSIS — R7303 Prediabetes: Secondary | ICD-10-CM | POA: Diagnosis not present

## 2023-10-29 LAB — HEMOGLOBIN A1C
Hgb A1c MFr Bld: 6.3 %{Hb} — ABNORMAL HIGH (ref <5.7)
Mean Plasma Glucose: 134 mg/dL
eAG (mmol/L): 7.4 mmol/L

## 2023-10-29 LAB — CBC WITH DIFFERENTIAL/PLATELET
Absolute Lymphocytes: 2836 {cells}/uL (ref 850–3900)
Absolute Monocytes: 785 {cells}/uL (ref 200–950)
Basophils Absolute: 31 {cells}/uL (ref 0–200)
Basophils Relative: 0.3 %
Eosinophils Absolute: 235 {cells}/uL (ref 15–500)
Eosinophils Relative: 2.3 %
HCT: 42.4 % (ref 35.0–45.0)
Hemoglobin: 13 g/dL (ref 11.7–15.5)
MCH: 25 pg — ABNORMAL LOW (ref 27.0–33.0)
MCHC: 30.7 g/dL — ABNORMAL LOW (ref 32.0–36.0)
MCV: 81.5 fL (ref 80.0–100.0)
MPV: 10 fL (ref 7.5–12.5)
Monocytes Relative: 7.7 %
Neutro Abs: 6314 {cells}/uL (ref 1500–7800)
Neutrophils Relative %: 61.9 %
Platelets: 357 10*3/uL (ref 140–400)
RBC: 5.2 10*6/uL — ABNORMAL HIGH (ref 3.80–5.10)
RDW: 16.1 % — ABNORMAL HIGH (ref 11.0–15.0)
Total Lymphocyte: 27.8 %
WBC: 10.2 10*3/uL (ref 3.8–10.8)

## 2023-10-29 LAB — COMPLETE METABOLIC PANEL WITH GFR
AG Ratio: 1.3 (calc) (ref 1.0–2.5)
ALT: 10 U/L (ref 6–29)
AST: 13 U/L (ref 10–35)
Albumin: 4.1 g/dL (ref 3.6–5.1)
Alkaline phosphatase (APISO): 87 U/L (ref 31–125)
BUN: 14 mg/dL (ref 7–25)
CO2: 24 mmol/L (ref 20–32)
Calcium: 9.3 mg/dL (ref 8.6–10.2)
Chloride: 105 mmol/L (ref 98–110)
Creat: 0.84 mg/dL (ref 0.50–0.99)
Globulin: 3.2 g/dL (ref 1.9–3.7)
Glucose, Bld: 103 mg/dL — ABNORMAL HIGH (ref 65–99)
Potassium: 4.4 mmol/L (ref 3.5–5.3)
Sodium: 138 mmol/L (ref 135–146)
Total Bilirubin: 0.5 mg/dL (ref 0.2–1.2)
Total Protein: 7.3 g/dL (ref 6.1–8.1)
eGFR: 85 mL/min/{1.73_m2} (ref >=60)

## 2023-10-29 LAB — LIPID PANEL
Cholesterol: 223 mg/dL — ABNORMAL HIGH (ref <200)
HDL: 36 mg/dL — ABNORMAL LOW (ref >=50)
LDL Cholesterol (Calc): 153 mg/dL — ABNORMAL HIGH
Non-HDL Cholesterol (Calc): 187 mg/dL — ABNORMAL HIGH (ref <130)
Total CHOL/HDL Ratio: 6.2 (calc) — ABNORMAL HIGH (ref <5.0)
Triglycerides: 197 mg/dL — ABNORMAL HIGH (ref <150)

## 2023-11-18 ENCOUNTER — Telehealth: Payer: Self-pay | Admitting: Internal Medicine

## 2023-11-19 NOTE — Telephone Encounter (Signed)
Disreguard

## 2023-11-20 DIAGNOSIS — Z419 Encounter for procedure for purposes other than remedying health state, unspecified: Secondary | ICD-10-CM | POA: Diagnosis not present

## 2023-11-25 ENCOUNTER — Ambulatory Visit: Payer: Medicaid Other | Admitting: Internal Medicine

## 2023-11-29 ENCOUNTER — Ambulatory Visit: Payer: Medicaid Other | Admitting: Internal Medicine

## 2023-12-03 ENCOUNTER — Other Ambulatory Visit: Payer: Self-pay

## 2023-12-03 ENCOUNTER — Ambulatory Visit: Payer: Medicaid Other | Admitting: Internal Medicine

## 2023-12-03 ENCOUNTER — Encounter: Payer: Self-pay | Admitting: Internal Medicine

## 2023-12-03 VITALS — BP 130/80 | HR 74 | Temp 98.2°F | Resp 16 | Ht 65.0 in | Wt 244.6 lb

## 2023-12-03 DIAGNOSIS — E66813 Obesity, class 3: Secondary | ICD-10-CM | POA: Diagnosis not present

## 2023-12-03 DIAGNOSIS — Z6841 Body Mass Index (BMI) 40.0 and over, adult: Secondary | ICD-10-CM | POA: Diagnosis not present
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
## 2023-12-12 MED FILL — Lisinopril Tab 20 MG: ORAL | 90 days supply | Qty: 90 | Fill #1 | Status: AC

## 2023-12-12 MED FILL — Metoprolol Tartrate Tab 50 MG: ORAL | 90 days supply | Qty: 180 | Fill #1 | Status: AC

## 2023-12-21 DIAGNOSIS — Z419 Encounter for procedure for purposes other than remedying health state, unspecified: Secondary | ICD-10-CM | POA: Diagnosis not present

## 2024-01-18 DIAGNOSIS — Z419 Encounter for procedure for purposes other than remedying health state, unspecified: Secondary | ICD-10-CM | POA: Diagnosis not present

## 2024-01-20 ENCOUNTER — Ambulatory Visit: Payer: Medicaid Other | Admitting: Dietician

## 2024-01-27 ENCOUNTER — Ambulatory Visit: Payer: Medicaid Other | Admitting: Dietician

## 2024-02-06 ENCOUNTER — Ambulatory Visit: Payer: Medicaid Other | Admitting: Dietician

## 2024-02-06 DIAGNOSIS — R768 Other specified abnormal immunological findings in serum: Secondary | ICD-10-CM | POA: Diagnosis not present

## 2024-02-06 DIAGNOSIS — Z87898 Personal history of other specified conditions: Secondary | ICD-10-CM | POA: Diagnosis not present

## 2024-02-06 DIAGNOSIS — Z9289 Personal history of other medical treatment: Secondary | ICD-10-CM | POA: Diagnosis not present

## 2024-02-06 DIAGNOSIS — M2550 Pain in unspecified joint: Secondary | ICD-10-CM | POA: Diagnosis not present

## 2024-02-18 ENCOUNTER — Ambulatory Visit
Admission: RE | Admit: 2024-02-18 | Discharge: 2024-02-18 | Disposition: A | Discharge status: Home / Self Care | Source: Ambulatory Visit | Attending: Internal Medicine | Admitting: Internal Medicine

## 2024-02-18 DIAGNOSIS — Z1231 Encounter for screening mammogram for malignant neoplasm of breast: Secondary | ICD-10-CM | POA: Diagnosis not present

## 2024-02-18 DIAGNOSIS — Z0000 Encounter for general adult medical examination without abnormal findings: Secondary | ICD-10-CM | POA: Diagnosis not present

## 2024-02-20 ENCOUNTER — Encounter: Payer: Self-pay | Admitting: Internal Medicine

## 2024-02-29 DIAGNOSIS — Z419 Encounter for procedure for purposes other than remedying health state, unspecified: Secondary | ICD-10-CM | POA: Diagnosis not present

## 2024-03-05 NOTE — Progress Notes (Signed)
 Medical Nutrition Therapy  Appointment Start time:  1528  Appointment End time:  1651  Primary concerns today: weight management  Referral diagnosis: - Class 3 severe obesity without serious comorbidity with body mass index (BMI) of 40.0 to 44.9 in adult, unspecified obesity type Preferred learning style: no preference indicated Learning readiness:  not ready, contemplating, ready, change in progress   NUTRITION ASSESSMENT   Clinical Medical Hx:  Past Medical History:  Diagnosis Date   Hypertension     Medications:  Current Outpatient Medications:    Blood Pressure KIT, USE AS DIRECTED DAILY., Disp: 1 kit, Rfl: 0   lisinopril  (ZESTRIL ) 20 MG tablet, Take 1 tablet (20 mg total) by mouth once daily., Disp: 90 tablet, Rfl: 1   methocarbamol  (ROBAXIN ) 500 MG tablet, Take 1 tablet (500 mg total) by mouth 3 (three) times daily as needed., Disp: 90 tablet, Rfl: 2   metoprolol  tartrate (LOPRESSOR ) 50 MG tablet, Take 1 tablet (50 mg total) by mouth 2 (two) times daily., Disp: 180 tablet, Rfl: 1  Labs:  Lab Results  Component Value Date   HGBA1C 6.3 (H) 10/28/2023   Lab Results  Component Value Date   CHOL 223 (H) 10/28/2023   HDL 36 (L) 10/28/2023   LDLCALC 153 (H) 10/28/2023   TRIG 197 (H) 10/28/2023   CHOLHDL 6.2 (H) 10/28/2023   BP Readings from Last 3 Encounters:  12/03/23 130/80  10/21/23 132/84  08/20/23 134/72   Notable Signs/Symptoms:  Wt Readings from Last 3 Encounters:  12/03/23 244 lb 9.6 oz (110.9 kg)  10/21/23 246 lb 6.4 oz (111.8 kg)  08/20/23 247 lb 14.4 oz (112.4 kg)   Lifestyle & Dietary Hx Pt presents today alone. Pt reports she does her own cooking and shopping. Pt reports she desires weight management to a lower BMI range and learn how to eat right. Pt denies previous nutrition education or counseling. Pt reports she follows lowered sodium meal pattern. Pt reports she prefers flat terrain when she walks and plans to increase physical activity participation.  Pt reports intake of sugary sweetened beverages daily. Pt reports she likes beverages with flavor at her meal times. Pt reports waking up in the middle of night 5 nights weekly and eats something. Pt reports eating out about four times weekly. Pt reports she is not working at this time. All Pt's questions were answered during this encounter.  Estimated daily fluid intake: 16*2 oz Supplements: none Sleep: improving Stress / self-care: 2-3 out of 10 / self care includes walks Current average weekly physical activity: 2-3 days weekly walking for 30 minutes, yard work,   24-Hr Dietary Recall First Meal: skips 5/d/w or ~ 1 cup oatmeal with sugar, milk, boiled egg Snack: none Second Meal: "largest meal" ~2 pm: grilled chicken sandwich [employer], dr pepper or tuna fish salad made with blended carrots, onions, apple, cucumbers, celery blended with mayo and sweet pickles on 1 slices of sourdough bread, sweet tea  Snack: none or  Third Meal: ~ 5-6 pm: baked salmon, broccoli, brussels sprouts or wendy's 4 nuggets, cheese burger, dr pepper Snack: ice cream, cookie, 1 slice of bread, 1 apple, 1 cucumber Beverages: water, regular soda, sweet tea  NUTRITION DIAGNOSIS  NB-1.1 Food and nutrition-related knowledge deficit As related to no prior nutrition related education.  As evidenced by Pt report and dietary recall.   NUTRITION INTERVENTION  Nutrition education (E-1) on the following topics:  Fruits & Vegetables: Aim to fill half your plate with a variety of  fruits and vegetables. They are rich in vitamins, minerals, and fiber, and can help reduce the risk of chronic diseases. Choose a colorful assortment of fruits and vegetables to ensure you get a wide range of nutrients. Grains and Starches: Make at least half of your grain choices whole grains, such as brown rice, whole wheat bread, and oats. Whole grains provide fiber, which aids in digestion and healthy cholesterol levels. Aim for whole forms of starchy  vegetables such as potatoes, sweet potatoes, beans, peas, and corn, which are fiber rich and provide many vitamins and minerals.  Protein: Incorporate lean sources of protein, such as poultry, fish, beans, nuts, and seeds, into your meals. Protein is essential for building and repairing tissues, staying full, balancing blood sugar, as well as supporting immune function. Dairy: Include low-fat or fat-free dairy products like milk, yogurt, and cheese in your diet. Dairy foods are excellent sources of calcium and vitamin D, which are crucial for bone health.  Physical Activity: Aim for 60 minutes of physical activity daily. Regular physical activity promotes overall health-including helping to reduce risk for heart disease and diabetes, promoting mental health, and helping us  sleep better.    Handouts Provided Include  [employer] Plate planner Snack List-Carbohydrate controlled snack list Move Your Way-[facility]  Learning Style & Readiness for Change Teaching method utilized: Visual & Auditory  Demonstrated degree of understanding via: Teach Back  Barriers to learning/adherence to lifestyle change: unknown  Goals Established by Pt Increase walking aiming for 3-5 days for 30 minutes as tolerated Decrease sugary sweetened beverage intake    MONITORING & EVALUATION Dietary intake, weekly physical activity  Next Steps  Patient is to return.

## 2024-03-09 ENCOUNTER — Other Ambulatory Visit: Payer: Self-pay

## 2024-03-09 ENCOUNTER — Other Ambulatory Visit: Payer: Self-pay | Admitting: Internal Medicine

## 2024-03-09 ENCOUNTER — Encounter: Attending: Internal Medicine | Admitting: Dietician

## 2024-03-09 DIAGNOSIS — Z713 Dietary counseling and surveillance: Secondary | ICD-10-CM | POA: Insufficient documentation

## 2024-03-09 DIAGNOSIS — Z6841 Body Mass Index (BMI) 40.0 and over, adult: Secondary | ICD-10-CM | POA: Diagnosis not present

## 2024-03-09 DIAGNOSIS — I10 Essential (primary) hypertension: Secondary | ICD-10-CM

## 2024-03-09 DIAGNOSIS — E66813 Obesity, class 3: Secondary | ICD-10-CM | POA: Insufficient documentation

## 2024-03-09 NOTE — Patient Instructions (Addendum)
 Increase walking aiming for 3-5 days for 30 minutes as tolerated Decrease sugary sweetened beverage intake

## 2024-03-10 ENCOUNTER — Other Ambulatory Visit: Payer: Self-pay

## 2024-03-10 MED FILL — Metoprolol Tartrate Tab 50 MG: ORAL | 90 days supply | Qty: 180 | Fill #0 | Status: AC

## 2024-03-10 MED FILL — Lisinopril Tab 20 MG: ORAL | 90 days supply | Qty: 90 | Fill #0 | Status: AC

## 2024-03-10 NOTE — Telephone Encounter (Signed)
 Requested Prescriptions  Pending Prescriptions Disp Refills   lisinopril  (ZESTRIL ) 20 MG tablet 90 tablet 0    Sig: Take 1 tablet (20 mg total) by mouth once daily.     Cardiovascular:  ACE Inhibitors Failed - 03/10/2024  2:27 PM      Failed - Valid encounter within last 6 months    Recent Outpatient Visits   None     Future Appointments             In 1 month Rockney Cid, DO [facility]            Passed - Cr in normal range and within 180 days    Creat  Date Value Ref Range Status  10/28/2023 0.84 0.50 - 0.99 mg/dL Final   Creatinine, Urine  Date Value Ref Range Status  10/24/2021 52 mg/dL Final         Passed - K in normal range and within 180 days    Potassium  Date Value Ref Range Status  10/28/2023 4.4 3.5 - 5.3 mmol/L Final         Passed - Patient is not pregnant      Passed - Last BP in normal range    BP Readings from Last 1 Encounters:  12/03/23 130/80          metoprolol  tartrate (LOPRESSOR ) 50 MG tablet 180 tablet 0    Sig: Take 1 tablet (50 mg total) by mouth 2 (two) times daily.     Cardiovascular:  Beta Blockers Failed - 03/10/2024  2:27 PM      Failed - Valid encounter within last 6 months    Recent Outpatient Visits   None     Future Appointments             In 1 month Rockney Cid, DO [facility]            Passed - Last BP in normal range    BP Readings from Last 1 Encounters:  12/03/23 130/80         Passed - Last Heart Rate in normal range    Pulse Readings from Last 1 Encounters:  12/03/23 74

## 2024-03-30 DIAGNOSIS — Z419 Encounter for procedure for purposes other than remedying health state, unspecified: Secondary | ICD-10-CM | POA: Diagnosis not present

## 2024-04-21 ENCOUNTER — Ambulatory Visit: Payer: Medicaid Other | Admitting: Internal Medicine

## 2024-04-28 DIAGNOSIS — F4312 Post-traumatic stress disorder, chronic: Secondary | ICD-10-CM | POA: Diagnosis not present

## 2024-04-30 DIAGNOSIS — Z419 Encounter for procedure for purposes other than remedying health state, unspecified: Secondary | ICD-10-CM | POA: Diagnosis not present

## 2024-05-21 ENCOUNTER — Ambulatory Visit: Admitting: Internal Medicine

## 2024-05-30 DIAGNOSIS — Z419 Encounter for procedure for purposes other than remedying health state, unspecified: Secondary | ICD-10-CM | POA: Diagnosis not present

## 2024-06-08 ENCOUNTER — Encounter: Payer: Self-pay | Admitting: Internal Medicine

## 2024-06-08 ENCOUNTER — Other Ambulatory Visit: Payer: Self-pay

## 2024-06-08 ENCOUNTER — Ambulatory Visit (INDEPENDENT_AMBULATORY_CARE_PROVIDER_SITE_OTHER): Admitting: Internal Medicine

## 2024-06-08 VITALS — BP 132/84 | HR 84 | Temp 97.7°F | Resp 16 | Ht 65.0 in | Wt 223.9 lb

## 2024-06-08 DIAGNOSIS — E669 Obesity, unspecified: Secondary | ICD-10-CM

## 2024-06-08 DIAGNOSIS — E782 Mixed hyperlipidemia: Secondary | ICD-10-CM | POA: Diagnosis not present

## 2024-06-08 DIAGNOSIS — R7303 Prediabetes: Secondary | ICD-10-CM | POA: Diagnosis not present

## 2024-06-08 DIAGNOSIS — I10 Essential (primary) hypertension: Secondary | ICD-10-CM | POA: Diagnosis not present

## 2024-06-08 MED ORDER — LISINOPRIL 20 MG PO TABS
20.0000 mg | ORAL_TABLET | Freq: Every day | ORAL | 1 refills | Status: AC
  Filled 2024-06-08: qty 90, 90d supply, fill #0
  Filled 2024-09-08: qty 90, 90d supply, fill #1

## 2024-06-08 MED ORDER — METOPROLOL TARTRATE 50 MG PO TABS
50.0000 mg | ORAL_TABLET | Freq: Two times a day (BID) | ORAL | 1 refills | Status: AC
  Filled 2024-06-08 – 2024-09-08 (×2): qty 180, 90d supply, fill #0

## 2024-06-08 NOTE — Progress Notes (Signed)
 Established Patient Office Visit  Subjective    Patient ID: Cynthia Hodge, female    DOB: 10/29/1976  Age: 48 y.o. MRN: [id number]  CC:  Chief Complaint  Patient presents with   Medical Management of Chronic Issues    6 month recheck    HPI Cynthia Hodge presents to follow up on chronic medical conditions.   Discussed the use of AI scribe software for clinical note transcription with the patient, who gave verbal consent to proceed.  History of Present Illness Cynthia Hodge is a 48 year old female who presents for a follow-up visit regarding her weight loss and dietary changes.  She has lost 21 pounds since January, now weighing 220 pounds, due to dietary changes such as reduced soda and sweet tea intake, and altered meal timing and composition. Her diet now includes more high protein and fiber meals, healthier snacks, and increased water intake, resulting in increased energy and improved sleep quality. She has reduced red meat and soda consumption, opting for chicken, [place], fish, and shrimp, and uses olive oil and balsamic vinegar as dressings.  She experiences perimenopausal symptoms, including lighter menstrual periods and occasional hot flashes. She is currently taking metoprolol  and lisinopril . Her blood sugar and cholesterol levels were slightly elevated at her last visit.   Hypertension: -Medications: Lisinopril  20 mg, Metoprolol  50 mg BID -Patient is compliant with above medications and reports no side effects. -Checking BP at home (average): doesn't check regularly  -Denies any SOB, CP, vision changes, LE edema or symptoms of hypotension  HLD: -Medications: Nothing  -Last lipid panel: Lipid Panel     Component Value Date/Time   CHOL 223 (H) 10/28/2023 1533   CHOL 203 (H) 10/17/2022 1215   TRIG 197 (H) 10/28/2023 1533   HDL 36 (L) 10/28/2023 1533   HDL 34 (L) 10/17/2022 1215   CHOLHDL 6.2 (H) 10/28/2023 1533   LDLCALC 153 (H) 10/28/2023 1533   LABVLDL  32 10/17/2022 1215   The 10-year ASCVD risk score (Arnett DK, et al., 2019) is: 3.3%   Values used to calculate the score:     Age: 44 years     Clincally relevant sex: Female     Is Non-Hispanic African American: No     Diabetic: No     Tobacco smoker: No     Systolic Blood Pressure: 132 mmHg     Is BP treated: Yes     HDL Cholesterol: 36 mg/dL     Total Cholesterol: 223 mg/dL  Pre-Diabetes: -Last J8r 6.3% 12/24 -Not on any medication currently  History of positive ANA: -Positive in 11/23 -Had been following with Rheumatology   Recurrent Yeast Infections: -Does have current vaginal itching and change in vaginal discharge  Health Maintenance: -Blood work due -Pap due -Colon cancer screening: colonoscopy 5/24, repeat in 10 years  Outpatient Encounter Medications as of 06/08/2024  Medication Sig   lisinopril  (ZESTRIL ) 20 MG tablet Take 1 tablet (20 mg total) by mouth once daily.   methocarbamol  (ROBAXIN ) 500 MG tablet Take 1 tablet (500 mg total) by mouth 3 (three) times daily as needed.   metoprolol  tartrate (LOPRESSOR ) 50 MG tablet Take 1 tablet (50 mg total) by mouth 2 (two) times daily.   Blood Pressure KIT USE AS DIRECTED DAILY.   No facility-administered encounter medications on file as of 06/08/2024.    Past Medical History:  Diagnosis Date   Hypertension     Past Surgical History:  Procedure Laterality Date  COLONOSCOPY WITH PROPOFOL  N/A 04/09/2023   Procedure: COLONOSCOPY WITH PROPOFOL ;  Surgeon: Unk Corinn Skiff, MD;  Location: [facility] ENDOSCOPY;  Service: Gastroenterology;  Laterality: N/A;   NO PAST SURGERIES      Family History  Problem Relation Age of Onset   Stroke Mother    Dementia Mother    Hypertension Father    Hypertension Sister    Diabetes Paternal Uncle    Other Maternal Grandmother        childbirth   Other Maternal Grandfather        carbon monoxide posioning   Cervical cancer Paternal Grandmother    Diabetes Paternal Grandfather     Breast cancer Neg Hx     Social History   Socioeconomic History   Marital status: Single    Spouse name: Not on file   Number of children: Not on file   Years of education: Not on file   Highest education level: Associate degree: occupational, Scientist, product/process development, or vocational program  Occupational History   Not on file  Tobacco Use   Smoking status: Never   Smokeless tobacco: Never  Vaping Use   Vaping status: Never Used  Substance and Sexual Activity   Alcohol use: Not Currently   Drug use: Never   Sexual activity: Not Currently  Other Topics Concern   Not on file  Social History Narrative   Not on file   Social Drivers of Health   Financial Resource Strain: Low Risk  (11/25/2023)   Overall Financial Resource Strain (CARDIA)    Difficulty of Paying Living Expenses: Not very hard  Food Insecurity: No Food Insecurity (03/09/2024)   Hunger Vital Sign    Worried About Running Out of Food in the Last Year: Never true    Ran Out of Food in the Last Year: Never true  Transportation Needs: No Transportation Needs (11/25/2023)   PRAPARE - Administrator, Civil Service (Medical): No    Lack of Transportation (Non-Medical): No  Physical Activity: Insufficiently Active (11/25/2023)   Exercise Vital Sign    Days of Exercise per Week: 3 days    Minutes of Exercise per Session: 20 min  Stress: No Stress Concern Present (11/25/2023)   [employer] of Occupational Health - Occupational Stress Questionnaire    Feeling of Stress : Only a little  Social Connections: Moderately Isolated (11/25/2023)   Social Connection and Isolation Panel    Frequency of Communication with Friends and Family: Three times a week    Frequency of Social Gatherings with Friends and Family: More than three times a week    Attends Religious Services: More than 4 times per year    Active Member of [employer] or Organizations: No    Attends Banker Meetings: Never    Marital Status: Never married   Intimate Partner Violence: Not At Risk (10/21/2023)   Humiliation, Afraid, Rape, and Kick questionnaire    Fear of Current or Ex-Partner: No    Emotionally Abused: No    Physically Abused: No    Sexually Abused: No    Review of Systems  All other systems reviewed and are negative.       Objective    BP 132/84 (Cuff Size: Large)   Pulse 84   Temp 97.7 F (36.5 C) (Oral)   Resp 16   Ht 5' 5 (1.651 m)   Wt 223 lb 14.4 oz (101.6 kg)   SpO2 95%   BMI 37.26 kg/m   Physical Exam  Constitutional:      Appearance: Normal appearance.  HENT:     Head: Normocephalic and atraumatic.  Eyes:     Conjunctiva/sclera: Conjunctivae normal.  Cardiovascular:     Rate and Rhythm: Normal rate and regular rhythm.  Pulmonary:     Effort: Pulmonary effort is normal.     Breath sounds: Normal breath sounds.  Skin:    General: Skin is warm and dry.  Neurological:     General: No focal deficit present.     Mental Status: She is alert. Mental status is at baseline.  Psychiatric:        Mood and Affect: Mood normal.        Behavior: Behavior normal.         Assessment & Plan:   Assessment & Plan Hypertension Blood pressure controlled with metoprolol  and lisinopril . - Refill metoprolol  and lisinopril  prescriptions. - Maintain current lifestyle changes.  Hyperlipidemia Cholesterol levels elevated. Dietary changes implemented. Fasting lipid panel ordered to assess levels. Genetic factors considered if levels remain high. - Order fasting lipid panel. - Continue dietary changes with focus on lean proteins and reduced red meat consumption.  Pre-Diabetes - Recheck A1c.   Weight Loss Lost 21 pounds since May through dietary changes. Reports increased energy and improved sleep. - Continue current dietary plan with focus on high protein, healthy fats, and low sugar and carbs. - Maintain reduced intake of sodas and sweet tea. - Continue working with nutritionist for dietary  guidance.  Perimenopause Experiencing symptoms of perimenopause. Educated on transition to menopause and postmenopausal status. - Provide education on perimenopause and menopause.  General Health Maintenance Advised on dietary guidelines including complex carbohydrates, lean proteins, healthy fats, natural sweeteners, and water intake. - Continue current healthy lifestyle changes. - Use Stevia as a natural sweetener. - Ensure adequate water intake.  Follow-up Fasting labs needed for cholesterol and blood sugar levels. - Return for fasting labs for cholesterol and blood sugar. - Schedule follow-up appointment in six months.  - metoprolol  tartrate (LOPRESSOR ) 50 MG tablet; Take 1 tablet (50 mg total) by mouth 2 (two) times daily.  Dispense: 180 tablet; Refill: 1 - lisinopril  (ZESTRIL ) 20 MG tablet; Take 1 tablet (20 mg total) by mouth once daily.  Dispense: 90 tablet; Refill: 1 - HgB A1c - Lipid Profile   Return in about 6 months (around 12/09/2024).   Sharyle Fischer, DO

## 2024-06-12 ENCOUNTER — Other Ambulatory Visit: Payer: Self-pay

## 2024-06-30 DIAGNOSIS — Z419 Encounter for procedure for purposes other than remedying health state, unspecified: Secondary | ICD-10-CM | POA: Diagnosis not present

## 2024-07-31 DIAGNOSIS — Z419 Encounter for procedure for purposes other than remedying health state, unspecified: Secondary | ICD-10-CM | POA: Diagnosis not present

## 2024-09-09 ENCOUNTER — Other Ambulatory Visit: Payer: Self-pay

## 2024-09-30 DIAGNOSIS — Z419 Encounter for procedure for purposes other than remedying health state, unspecified: Secondary | ICD-10-CM | POA: Diagnosis not present

## 2024-12-02 ENCOUNTER — Other Ambulatory Visit: Payer: Self-pay

## 2024-12-02 MED ORDER — TRIAMCINOLONE ACETONIDE 0.1 % EX CREA
1.0000 | TOPICAL_CREAM | Freq: Two times a day (BID) | CUTANEOUS | 0 refills | Status: AC
  Filled 2024-12-02: qty 15, 30d supply, fill #0

## 2024-12-02 MED ORDER — PREDNISONE 20 MG PO TABS
ORAL_TABLET | ORAL | 0 refills | Status: AC
  Filled 2024-12-02: qty 12, 6d supply, fill #0

## 2024-12-10 ENCOUNTER — Ambulatory Visit: Admitting: Internal Medicine

## 2025-01-05 ENCOUNTER — Ambulatory Visit: Admitting: Internal Medicine
